# Patient Record
Sex: Male | Born: 1945 | Race: White | Hispanic: No | Marital: Married | Smoking: Former smoker
Health system: Southern US, Community
[De-identification: ages and names within clinical notes are randomized; demographics above are authoritative.]

## PROBLEM LIST (undated history)

## (undated) DIAGNOSIS — Z8719 Personal history of other diseases of the digestive system: Secondary | ICD-10-CM

## (undated) DIAGNOSIS — M1990 Unspecified osteoarthritis, unspecified site: Secondary | ICD-10-CM

## (undated) DIAGNOSIS — T7840XA Allergy, unspecified, initial encounter: Secondary | ICD-10-CM

## (undated) DIAGNOSIS — Z9189 Other specified personal risk factors, not elsewhere classified: Secondary | ICD-10-CM

## (undated) DIAGNOSIS — I10 Essential (primary) hypertension: Secondary | ICD-10-CM

## (undated) DIAGNOSIS — E119 Type 2 diabetes mellitus without complications: Secondary | ICD-10-CM

## (undated) DIAGNOSIS — Z9889 Other specified postprocedural states: Secondary | ICD-10-CM

## (undated) DIAGNOSIS — Z860100 Personal history of colon polyps, unspecified: Secondary | ICD-10-CM

## (undated) DIAGNOSIS — H269 Unspecified cataract: Secondary | ICD-10-CM

## (undated) DIAGNOSIS — N471 Phimosis: Secondary | ICD-10-CM

## (undated) DIAGNOSIS — K2270 Barrett's esophagus without dysplasia: Secondary | ICD-10-CM

## (undated) DIAGNOSIS — K219 Gastro-esophageal reflux disease without esophagitis: Secondary | ICD-10-CM

## (undated) DIAGNOSIS — Z973 Presence of spectacles and contact lenses: Secondary | ICD-10-CM

## (undated) DIAGNOSIS — K5730 Diverticulosis of large intestine without perforation or abscess without bleeding: Secondary | ICD-10-CM

## (undated) DIAGNOSIS — G43909 Migraine, unspecified, not intractable, without status migrainosus: Secondary | ICD-10-CM

## (undated) HISTORY — DX: Allergy, unspecified, initial encounter: T78.40XA

## (undated) HISTORY — PX: UPPER GASTROINTESTINAL ENDOSCOPY: SHX188

## (undated) HISTORY — DX: Unspecified cataract: H26.9

## (undated) HISTORY — PX: ROTATOR CUFF REPAIR: SHX139

## (undated) HISTORY — DX: Migraine, unspecified, not intractable, without status migrainosus: G43.909

## (undated) HISTORY — PX: POLYPECTOMY: SHX149

## (undated) HISTORY — DX: Gastro-esophageal reflux disease without esophagitis: K21.9

## (undated) HISTORY — DX: Essential (primary) hypertension: I10

## (undated) HISTORY — PX: COLONOSCOPY: SHX174

## (undated) HISTORY — PX: CHOLECYSTECTOMY: SHX55

---

## 1998-06-19 HISTORY — PX: BICEPS TENDON REPAIR: SHX566

## 1999-01-14 ENCOUNTER — Ambulatory Visit (HOSPITAL_COMMUNITY): Admission: RE | Admit: 1999-01-14 | Discharge: 1999-01-14 | Payer: Self-pay | Admitting: Family Medicine

## 1999-01-14 ENCOUNTER — Encounter: Payer: Self-pay | Admitting: Family Medicine

## 2000-05-02 ENCOUNTER — Encounter (INDEPENDENT_AMBULATORY_CARE_PROVIDER_SITE_OTHER): Payer: Self-pay | Admitting: Specialist

## 2000-05-02 ENCOUNTER — Other Ambulatory Visit: Admission: RE | Admit: 2000-05-02 | Discharge: 2000-05-02 | Payer: Self-pay | Admitting: Gastroenterology

## 2000-06-19 HISTORY — PX: KNEE ARTHROSCOPY: SUR90

## 2006-10-18 ENCOUNTER — Encounter: Admission: RE | Admit: 2006-10-18 | Discharge: 2006-10-18 | Payer: Self-pay | Admitting: Orthopedic Surgery

## 2007-05-13 ENCOUNTER — Ambulatory Visit: Payer: Self-pay | Admitting: Gastroenterology

## 2007-05-27 ENCOUNTER — Ambulatory Visit: Payer: Self-pay | Admitting: Gastroenterology

## 2007-05-27 ENCOUNTER — Encounter (INDEPENDENT_AMBULATORY_CARE_PROVIDER_SITE_OTHER): Payer: Self-pay | Admitting: *Deleted

## 2008-09-26 ENCOUNTER — Inpatient Hospital Stay (HOSPITAL_COMMUNITY): Admission: EM | Admit: 2008-09-26 | Discharge: 2008-10-02 | Payer: Self-pay | Admitting: Emergency Medicine

## 2008-09-26 ENCOUNTER — Encounter (INDEPENDENT_AMBULATORY_CARE_PROVIDER_SITE_OTHER): Payer: Self-pay | Admitting: *Deleted

## 2008-10-02 ENCOUNTER — Encounter (INDEPENDENT_AMBULATORY_CARE_PROVIDER_SITE_OTHER): Payer: Self-pay | Admitting: *Deleted

## 2008-10-05 ENCOUNTER — Encounter: Payer: Self-pay | Admitting: Gastroenterology

## 2008-11-02 ENCOUNTER — Encounter: Payer: Self-pay | Admitting: Gastroenterology

## 2008-12-09 DIAGNOSIS — R197 Diarrhea, unspecified: Secondary | ICD-10-CM | POA: Insufficient documentation

## 2008-12-09 DIAGNOSIS — K210 Gastro-esophageal reflux disease with esophagitis: Secondary | ICD-10-CM

## 2008-12-09 DIAGNOSIS — K869 Disease of pancreas, unspecified: Secondary | ICD-10-CM | POA: Insufficient documentation

## 2008-12-09 DIAGNOSIS — M109 Gout, unspecified: Secondary | ICD-10-CM

## 2008-12-09 DIAGNOSIS — N400 Enlarged prostate without lower urinary tract symptoms: Secondary | ICD-10-CM

## 2008-12-09 DIAGNOSIS — I10 Essential (primary) hypertension: Secondary | ICD-10-CM | POA: Insufficient documentation

## 2008-12-09 DIAGNOSIS — M1990 Unspecified osteoarthritis, unspecified site: Secondary | ICD-10-CM | POA: Insufficient documentation

## 2008-12-09 DIAGNOSIS — G7249 Other inflammatory and immune myopathies, not elsewhere classified: Secondary | ICD-10-CM | POA: Insufficient documentation

## 2008-12-09 DIAGNOSIS — N529 Male erectile dysfunction, unspecified: Secondary | ICD-10-CM

## 2008-12-09 DIAGNOSIS — E1165 Type 2 diabetes mellitus with hyperglycemia: Secondary | ICD-10-CM

## 2008-12-09 DIAGNOSIS — E7800 Pure hypercholesterolemia, unspecified: Secondary | ICD-10-CM | POA: Insufficient documentation

## 2008-12-09 DIAGNOSIS — K219 Gastro-esophageal reflux disease without esophagitis: Secondary | ICD-10-CM

## 2008-12-11 ENCOUNTER — Ambulatory Visit: Payer: Self-pay | Admitting: Gastroenterology

## 2008-12-11 DIAGNOSIS — K862 Cyst of pancreas: Secondary | ICD-10-CM | POA: Insufficient documentation

## 2008-12-11 DIAGNOSIS — K863 Pseudocyst of pancreas: Secondary | ICD-10-CM

## 2008-12-11 DIAGNOSIS — K859 Acute pancreatitis without necrosis or infection, unspecified: Secondary | ICD-10-CM | POA: Insufficient documentation

## 2008-12-17 ENCOUNTER — Encounter: Payer: Self-pay | Admitting: Gastroenterology

## 2008-12-18 ENCOUNTER — Encounter: Admission: RE | Admit: 2008-12-18 | Discharge: 2008-12-18 | Payer: Self-pay | Admitting: General Surgery

## 2009-01-01 ENCOUNTER — Inpatient Hospital Stay (HOSPITAL_COMMUNITY): Admission: AD | Admit: 2009-01-01 | Discharge: 2009-01-14 | Payer: Self-pay | Admitting: General Surgery

## 2009-01-01 ENCOUNTER — Encounter: Admission: RE | Admit: 2009-01-01 | Discharge: 2009-01-01 | Payer: Self-pay | Admitting: General Surgery

## 2009-01-07 ENCOUNTER — Encounter (INDEPENDENT_AMBULATORY_CARE_PROVIDER_SITE_OTHER): Payer: Self-pay | Admitting: General Surgery

## 2009-01-07 HISTORY — PX: OTHER SURGICAL HISTORY: SHX169

## 2009-01-26 ENCOUNTER — Encounter: Admission: RE | Admit: 2009-01-26 | Discharge: 2009-01-26 | Payer: Self-pay | Admitting: General Surgery

## 2009-01-28 ENCOUNTER — Encounter: Payer: Self-pay | Admitting: Gastroenterology

## 2009-03-02 ENCOUNTER — Encounter: Payer: Self-pay | Admitting: Gastroenterology

## 2010-03-30 IMAGING — CR DG CHEST 2V
2 series · 2 of 2 positions shown · non-contrast
Comparison: None

CLINICAL DATA: Cough, fever, abdominal pain

CHEST - 2 VIEW

[view not recorded (1 of 2)]
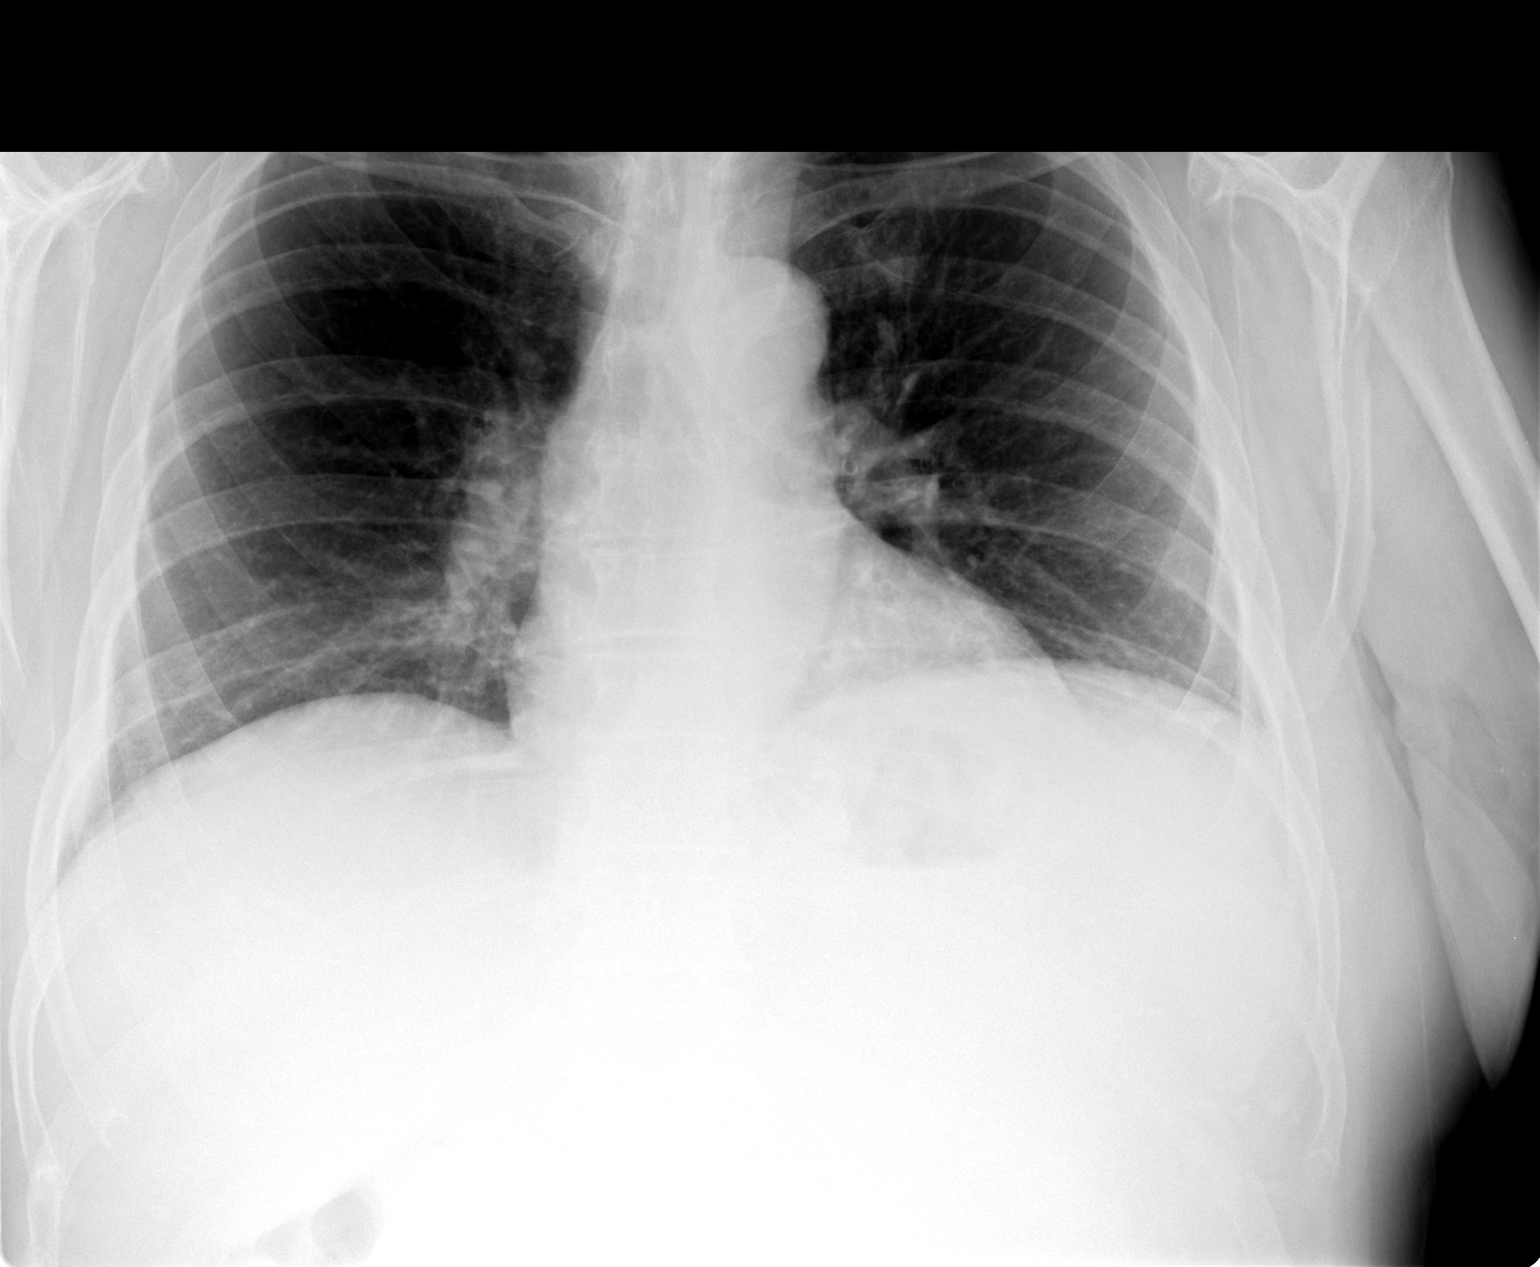

[view not recorded (2 of 2)]
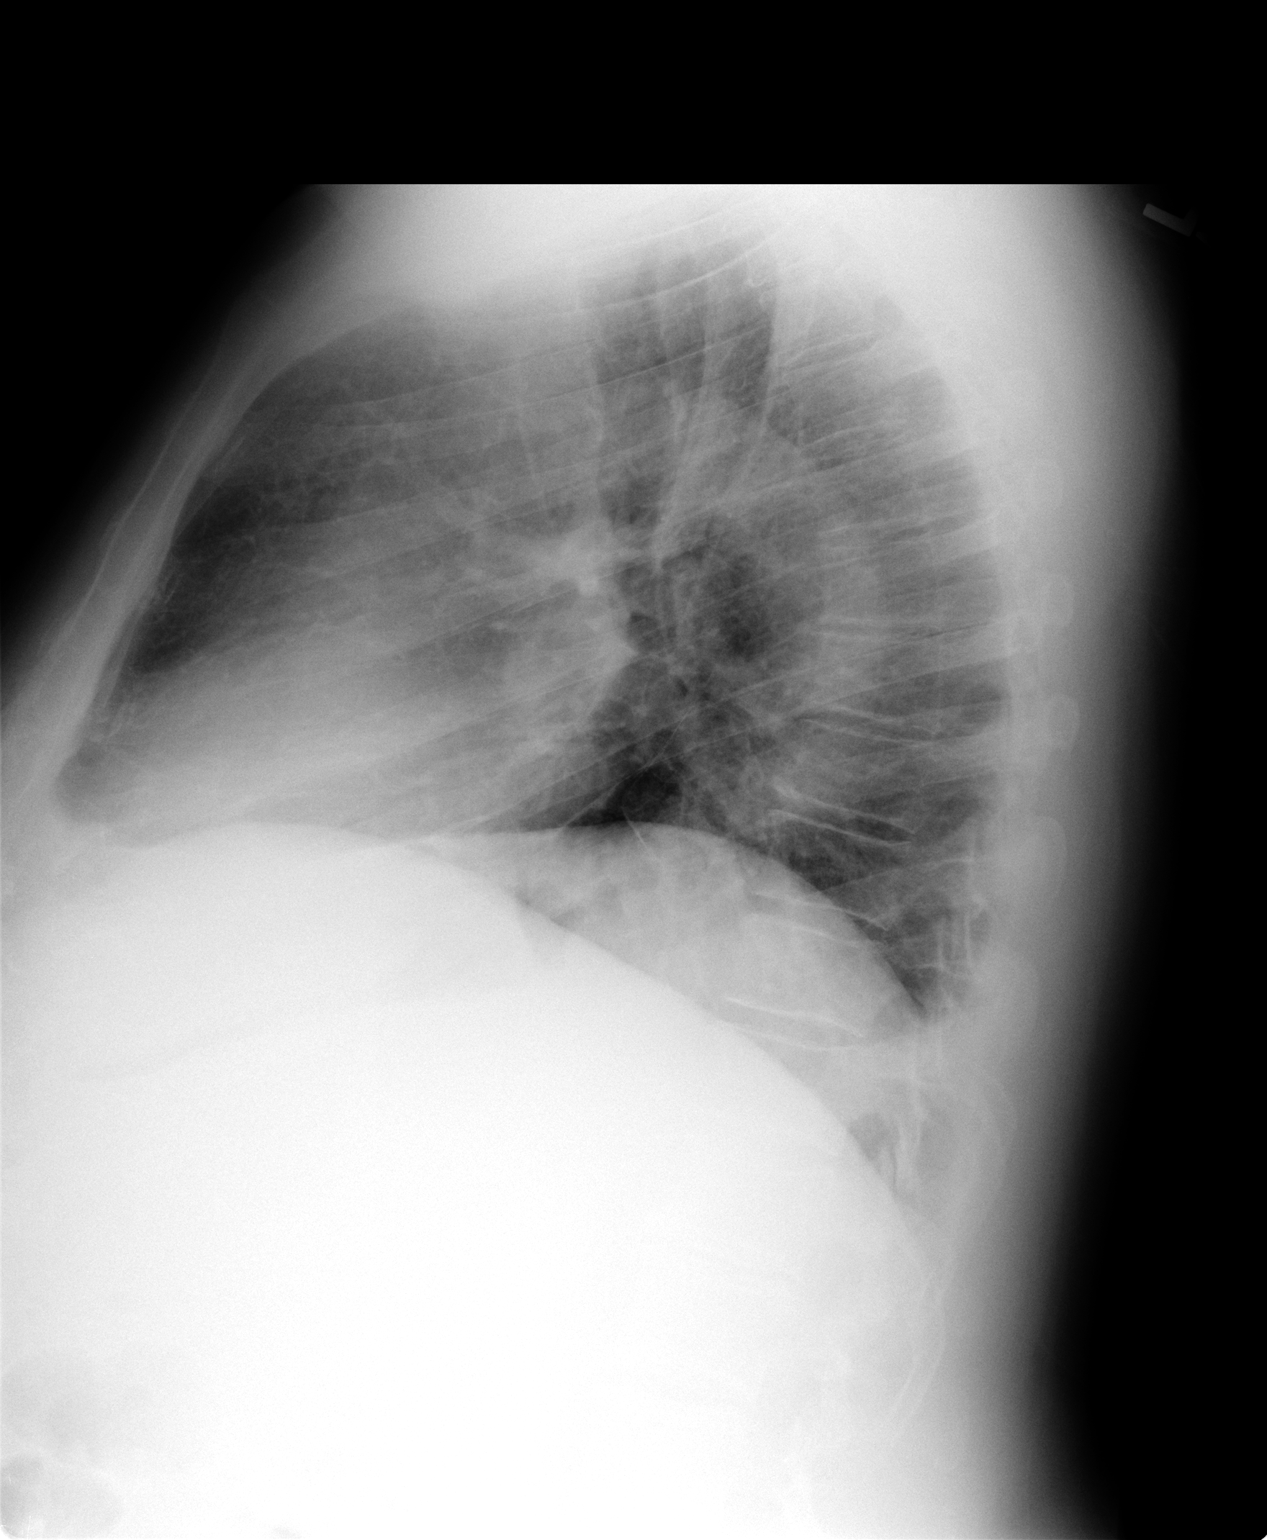

[2 of 2 positions shown; findings below may reference images not displayed]

FINDINGS: No active infiltrate or effusion is seen.  Mild
peribronchial thickening is noted.  The heart is within upper
limits of normal.  No acute bony abnormality is seen.
IMPRESSION: No active lung disease.  Mild peribronchial thickening.

## 2010-04-03 IMAGING — CR DG CHEST 1V PORT
1 series · 1 of 1 positions shown · non-contrast
Comparison: 01/01/2009

CLINICAL DATA: Right-sided PICC line placement

PORTABLE CHEST - 1 VIEW

[view not recorded]
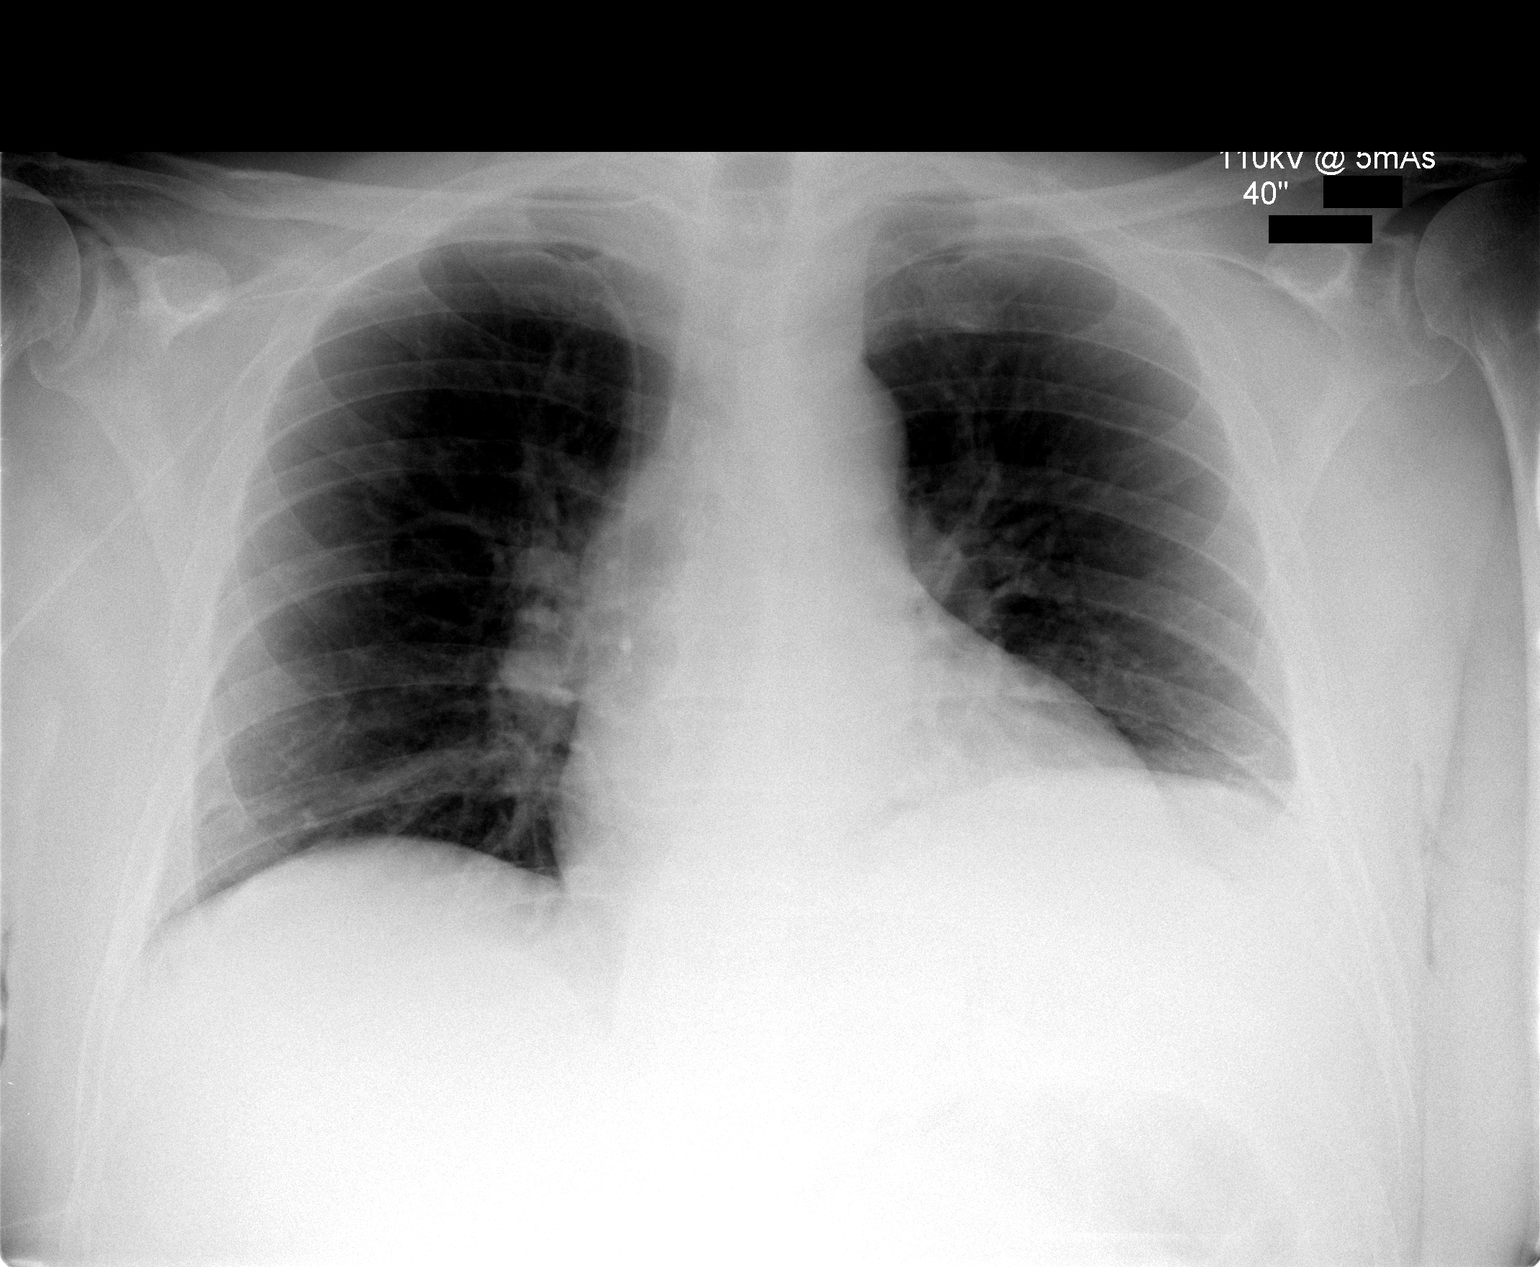

[1 of 1 positions shown; findings below may reference images not displayed]

FINDINGS: Right-sided PICC line noted with its tip at the
cavoatrial junction.  No pneumothorax.  Lungs are hypoaerated with
bibasilar atelectasis but clear.
IMPRESSION: Right-sided PICC line tip at cavoatrial junction.

## 2010-04-05 IMAGING — RF DG CHOLANGIOGRAM OPERATIVE
1 series · 4 of 4 positions shown · IV contrast (omnipaque)
Comparison: CT 01/01/2009

CLINICAL DATA: Pancreatic pseudocyst.

INTRAOPERATIVE CHOLANGIOGRAM
TECHNIQUE: Multiple fluoroscopic spot radiographs were obtained
during intraoperative cholangiogram and are submitted for
interpretation post-operatively.
Fluoroscopy Time: 23-second
Contrast: Omnipaque

[Series 1: run · 4 of 166 frames shown]
[frame 25/166]
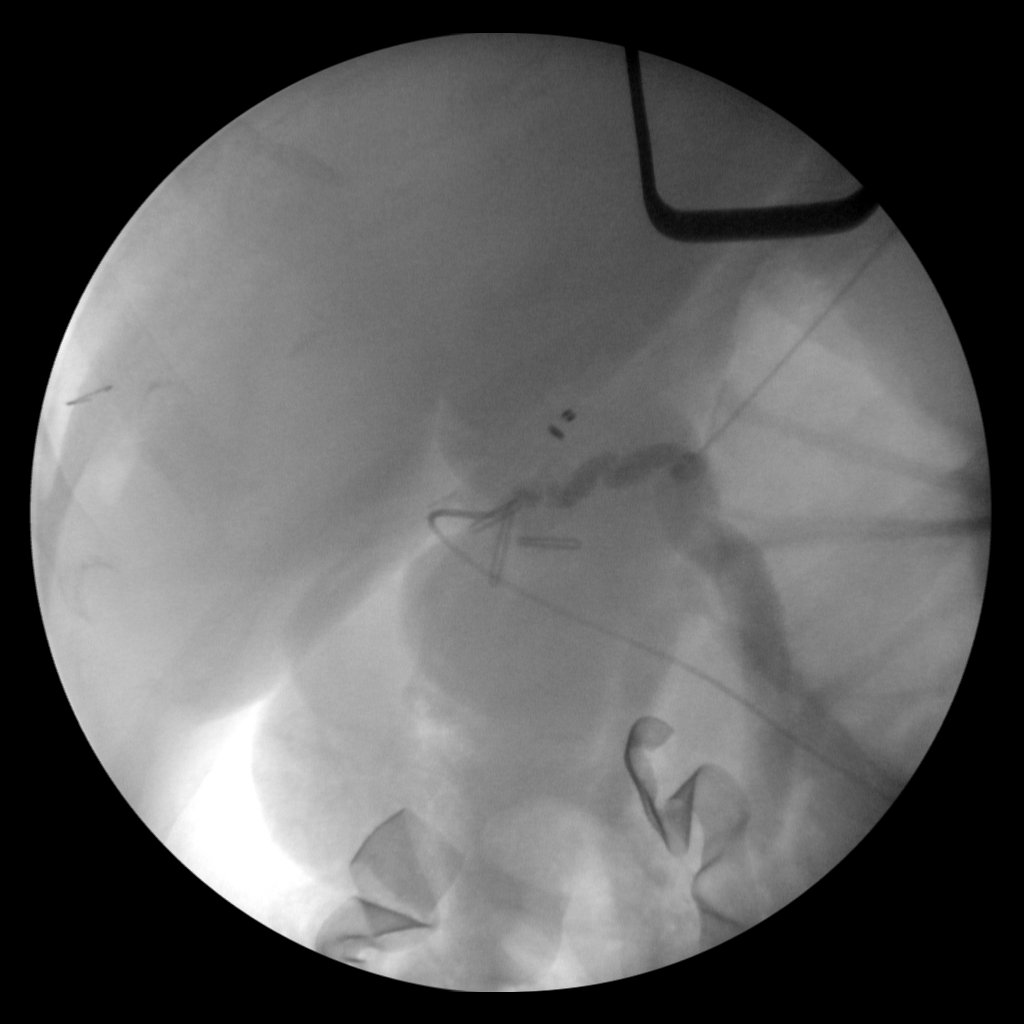
[frame 59/166]
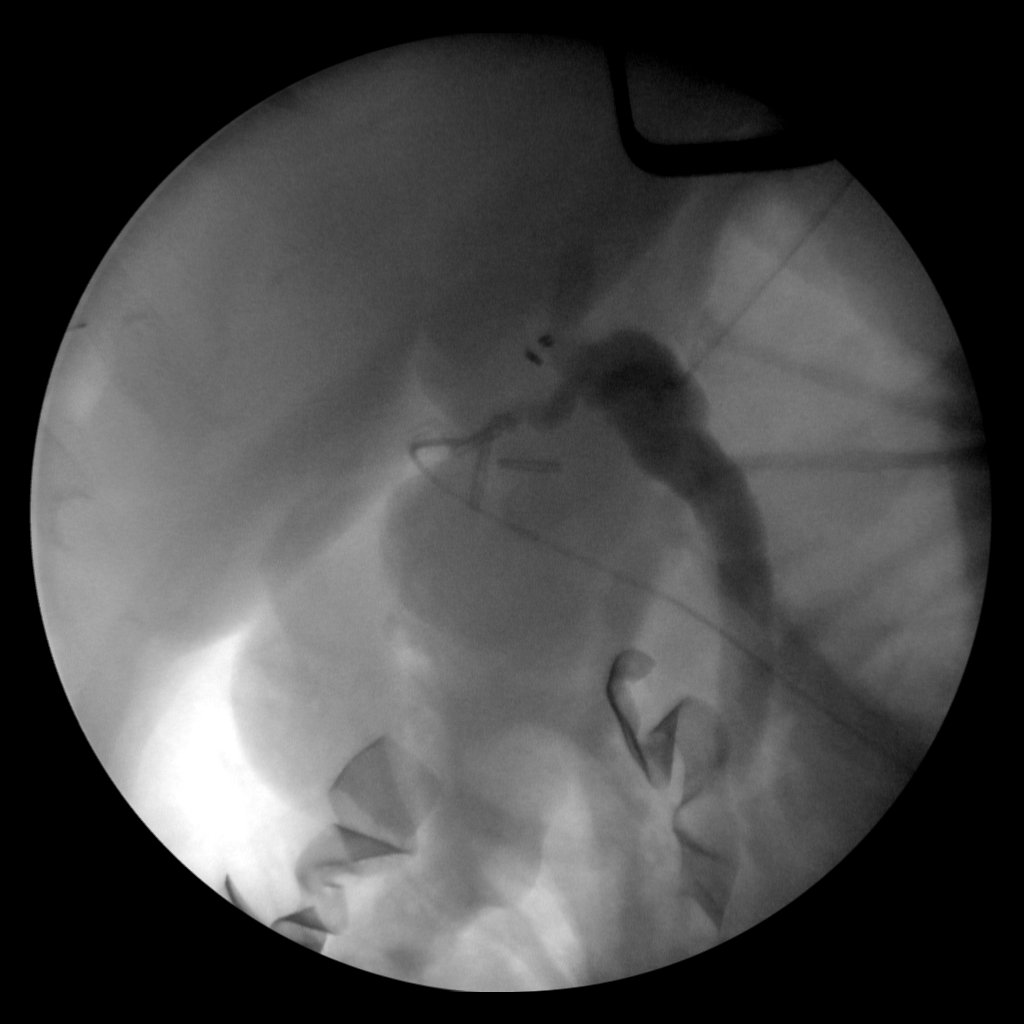
[frame 84/166]
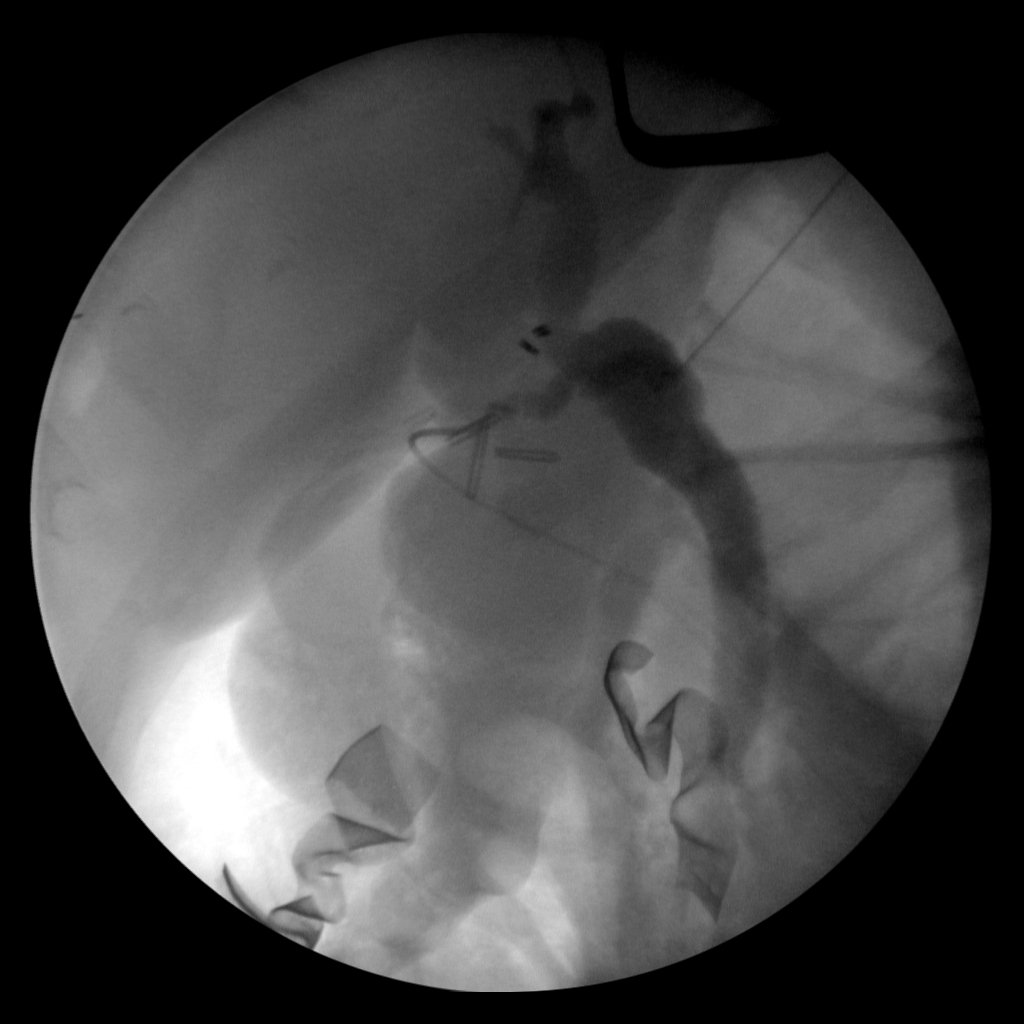
[frame 142/166]
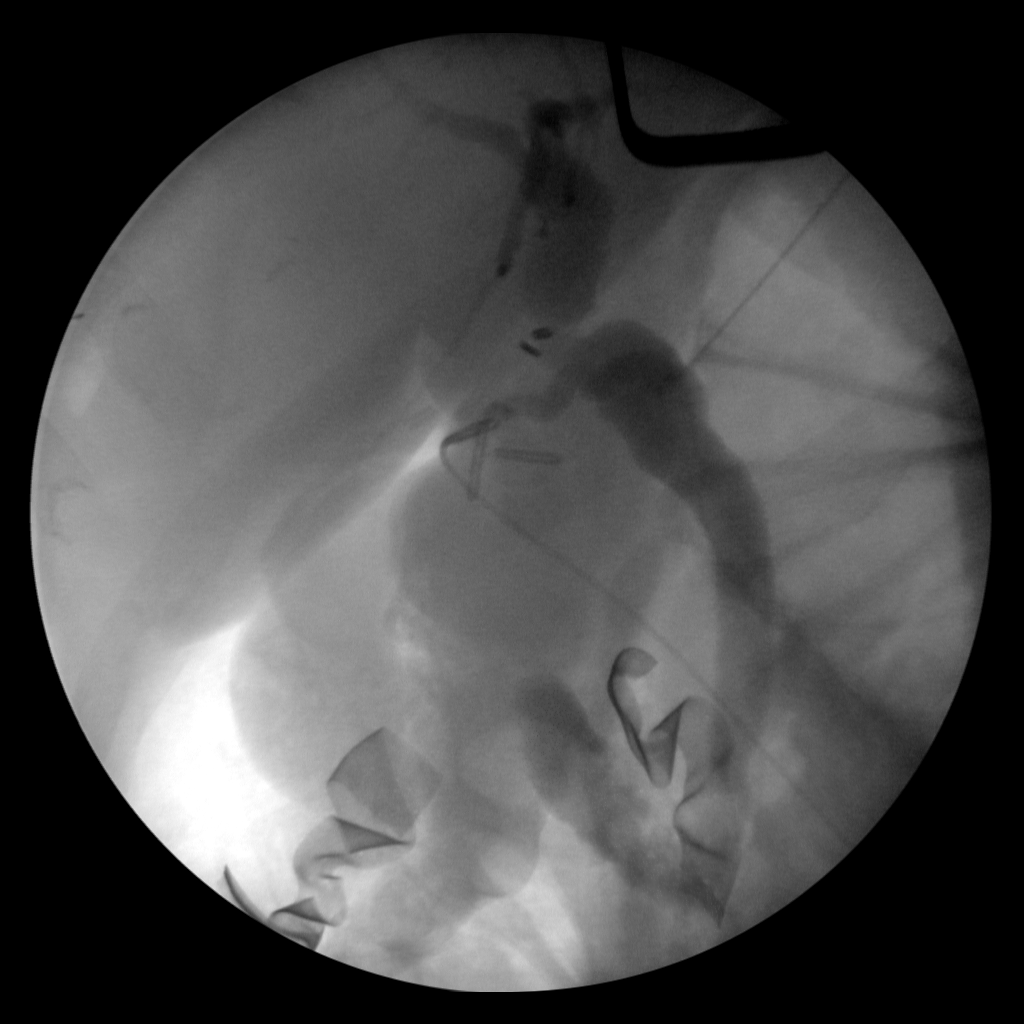

[4 of 4 positions shown; findings below may reference images not displayed]

FINDINGS: Multiple c-arm images were obtained.  Contrast injection
into  the cystic duct.  There is filling of the common bile duct
which is dilated.  There is partial filling of the intrahepatic
bile ducts.  Contrast does flow into the duodenum without
obstruction.  No filling defects in the bile ducts are noted.  No
evidence of biliary leak.
IMPRESSION: There is biliary dilatation without obstruction.  No common duct
stones are identified.  Ductal dilatation may be due to a large
pancreatic pseudocyst compressing the distal common bile duct.

## 2010-07-04 ENCOUNTER — Encounter: Payer: Self-pay | Admitting: Gastroenterology

## 2010-07-27 NOTE — Letter (Signed)
Summary: [facility]   Imported By: Rise Patience 07/18/2010 11:44:04  _____________________________________________________________________  External Attachment:    Type:   Image     Comment:   External Document

## 2010-09-25 LAB — GLUCOSE, CAPILLARY
Glucose-Capillary: 110 mg/dL — ABNORMAL HIGH (ref 70–99)
Glucose-Capillary: 111 mg/dL — ABNORMAL HIGH (ref 70–99)
Glucose-Capillary: 112 mg/dL — ABNORMAL HIGH (ref 70–99)
Glucose-Capillary: 115 mg/dL — ABNORMAL HIGH (ref 70–99)
Glucose-Capillary: 117 mg/dL — ABNORMAL HIGH (ref 70–99)
Glucose-Capillary: 122 mg/dL — ABNORMAL HIGH (ref 70–99)
Glucose-Capillary: 124 mg/dL — ABNORMAL HIGH (ref 70–99)
Glucose-Capillary: 126 mg/dL — ABNORMAL HIGH (ref 70–99)
Glucose-Capillary: 126 mg/dL — ABNORMAL HIGH (ref 70–99)
Glucose-Capillary: 133 mg/dL — ABNORMAL HIGH (ref 70–99)
Glucose-Capillary: 133 mg/dL — ABNORMAL HIGH (ref 70–99)
Glucose-Capillary: 133 mg/dL — ABNORMAL HIGH (ref 70–99)
Glucose-Capillary: 139 mg/dL — ABNORMAL HIGH (ref 70–99)
Glucose-Capillary: 139 mg/dL — ABNORMAL HIGH (ref 70–99)
Glucose-Capillary: 140 mg/dL — ABNORMAL HIGH (ref 70–99)
Glucose-Capillary: 141 mg/dL — ABNORMAL HIGH (ref 70–99)
Glucose-Capillary: 143 mg/dL — ABNORMAL HIGH (ref 70–99)
Glucose-Capillary: 144 mg/dL — ABNORMAL HIGH (ref 70–99)
Glucose-Capillary: 147 mg/dL — ABNORMAL HIGH (ref 70–99)
Glucose-Capillary: 149 mg/dL — ABNORMAL HIGH (ref 70–99)
Glucose-Capillary: 153 mg/dL — ABNORMAL HIGH (ref 70–99)
Glucose-Capillary: 154 mg/dL — ABNORMAL HIGH (ref 70–99)
Glucose-Capillary: 162 mg/dL — ABNORMAL HIGH (ref 70–99)
Glucose-Capillary: 162 mg/dL — ABNORMAL HIGH (ref 70–99)
Glucose-Capillary: 163 mg/dL — ABNORMAL HIGH (ref 70–99)
Glucose-Capillary: 164 mg/dL — ABNORMAL HIGH (ref 70–99)
Glucose-Capillary: 164 mg/dL — ABNORMAL HIGH (ref 70–99)
Glucose-Capillary: 173 mg/dL — ABNORMAL HIGH (ref 70–99)
Glucose-Capillary: 226 mg/dL — ABNORMAL HIGH (ref 70–99)
Glucose-Capillary: 84 mg/dL (ref 70–99)
Glucose-Capillary: 85 mg/dL (ref 70–99)
Glucose-Capillary: 87 mg/dL (ref 70–99)
Glucose-Capillary: 92 mg/dL (ref 70–99)

## 2010-09-25 LAB — CULTURE, ROUTINE-ABSCESS: Culture: NO GROWTH

## 2010-09-25 LAB — COMPREHENSIVE METABOLIC PANEL
ALT: 16 U/L (ref 0–53)
ALT: 23 U/L (ref 0–53)
ALT: 8 U/L (ref 0–53)
ALT: 8 U/L (ref 0–53)
ALT: <8 U/L (ref 0–53)
AST: 10 U/L (ref 0–37)
AST: 11 U/L (ref 0–37)
AST: 11 U/L (ref 0–37)
AST: 19 U/L (ref 0–37)
AST: 9 U/L (ref 0–37)
Albumin: 2.2 g/dL — ABNORMAL LOW (ref 3.5–5.2)
Albumin: 2.3 g/dL — ABNORMAL LOW (ref 3.5–5.2)
Albumin: 2.5 g/dL — ABNORMAL LOW (ref 3.5–5.2)
Albumin: 2.5 g/dL — ABNORMAL LOW (ref 3.5–5.2)
Albumin: 2.9 g/dL — ABNORMAL LOW (ref 3.5–5.2)
Alkaline Phosphatase: 40 U/L (ref 39–117)
Alkaline Phosphatase: 42 U/L (ref 39–117)
BUN: 11 mg/dL (ref 6–23)
BUN: 11 mg/dL (ref 6–23)
BUN: 20 mg/dL (ref 6–23)
BUN: 4 mg/dL — ABNORMAL LOW (ref 6–23)
CO2: 27 mEq/L (ref 19–32)
CO2: 28 mEq/L (ref 19–32)
CO2: 29 mEq/L (ref 19–32)
Calcium: 8.3 mg/dL — ABNORMAL LOW (ref 8.4–10.5)
Calcium: 8.4 mg/dL (ref 8.4–10.5)
Calcium: 8.5 mg/dL (ref 8.4–10.5)
Calcium: 8.7 mg/dL (ref 8.4–10.5)
Chloride: 100 mEq/L (ref 96–112)
Chloride: 102 mEq/L (ref 96–112)
Chloride: 102 mEq/L (ref 96–112)
Chloride: 105 mEq/L (ref 96–112)
Chloride: 105 mEq/L (ref 96–112)
Creatinine, Ser: 0.66 mg/dL (ref 0.4–1.5)
Creatinine, Ser: 0.69 mg/dL (ref 0.4–1.5)
Creatinine, Ser: 0.71 mg/dL (ref 0.4–1.5)
Creatinine, Ser: 0.76 mg/dL (ref 0.4–1.5)
Creatinine, Ser: 0.96 mg/dL (ref 0.4–1.5)
GFR calc Af Amer: >60 mL/min (ref >60)
GFR calc Af Amer: >60 mL/min (ref >60)
GFR calc Af Amer: >60 mL/min (ref >60)
GFR calc Af Amer: >60 mL/min (ref >60)
GFR calc Af Amer: >60 mL/min (ref >60)
GFR calc non Af Amer: >60 mL/min (ref >60)
GFR calc non Af Amer: >60 mL/min (ref >60)
GFR calc non Af Amer: >60 mL/min (ref >60)
Glucose, Bld: 146 mg/dL — ABNORMAL HIGH (ref 70–99)
Glucose, Bld: 183 mg/dL — ABNORMAL HIGH (ref 70–99)
Potassium: 3.9 mEq/L (ref 3.5–5.1)
Potassium: 5.3 mEq/L — ABNORMAL HIGH (ref 3.5–5.1)
Sodium: 132 mEq/L — ABNORMAL LOW (ref 135–145)
Sodium: 132 mEq/L — ABNORMAL LOW (ref 135–145)
Sodium: 134 mEq/L — ABNORMAL LOW (ref 135–145)
Sodium: 137 mEq/L (ref 135–145)
Total Bilirubin: 0.3 mg/dL (ref 0.3–1.2)
Total Bilirubin: 0.6 mg/dL (ref 0.3–1.2)
Total Bilirubin: 0.8 mg/dL (ref 0.3–1.2)
Total Bilirubin: 0.9 mg/dL (ref 0.3–1.2)
Total Protein: 5.4 g/dL — ABNORMAL LOW (ref 6.0–8.3)
Total Protein: 5.5 g/dL — ABNORMAL LOW (ref 6.0–8.3)
Total Protein: 5.6 g/dL — ABNORMAL LOW (ref 6.0–8.3)
Total Protein: 5.8 g/dL — ABNORMAL LOW (ref 6.0–8.3)
Total Protein: 5.8 g/dL — ABNORMAL LOW (ref 6.0–8.3)

## 2010-09-25 LAB — BASIC METABOLIC PANEL
BUN: 10 mg/dL (ref 6–23)
CO2: 30 mEq/L (ref 19–32)
CO2: 30 mEq/L (ref 19–32)
Calcium: 7.9 mg/dL — ABNORMAL LOW (ref 8.4–10.5)
Calcium: 8.2 mg/dL — ABNORMAL LOW (ref 8.4–10.5)
Chloride: 97 mEq/L (ref 96–112)
Chloride: 99 mEq/L (ref 96–112)
Creatinine, Ser: 0.56 mg/dL (ref 0.4–1.5)
GFR calc Af Amer: >60 mL/min (ref >60)
GFR calc Af Amer: >60 mL/min (ref >60)
GFR calc Af Amer: >60 mL/min (ref >60)
GFR calc non Af Amer: >60 mL/min (ref >60)
GFR calc non Af Amer: >60 mL/min (ref >60)
Glucose, Bld: 102 mg/dL — ABNORMAL HIGH (ref 70–99)
Glucose, Bld: 96 mg/dL (ref 70–99)
Potassium: 3.8 mEq/L (ref 3.5–5.1)
Potassium: 4.4 mEq/L (ref 3.5–5.1)
Sodium: 135 mEq/L (ref 135–145)
Sodium: 135 mEq/L (ref 135–145)

## 2010-09-25 LAB — PHOSPHORUS
Phosphorus: 3.6 mg/dL (ref 2.3–4.6)
Phosphorus: 3.6 mg/dL (ref 2.3–4.6)
Phosphorus: 5.4 mg/dL — ABNORMAL HIGH (ref 2.3–4.6)

## 2010-09-25 LAB — DIFFERENTIAL
Eosinophils Absolute: 0 10*3/uL (ref 0.0–0.7)
Eosinophils Absolute: 0.2 10*3/uL (ref 0.0–0.7)
Lymphocytes Relative: 15 % (ref 12–46)
Lymphs Abs: 0.7 10*3/uL (ref 0.7–4.0)
Lymphs Abs: 1.1 10*3/uL (ref 0.7–4.0)
Monocytes Absolute: 0.5 10*3/uL (ref 0.1–1.0)
Monocytes Relative: 19 % — ABNORMAL HIGH (ref 3–12)
Monocytes Relative: 7 % (ref 3–12)
Neutrophils Relative %: 66 % (ref 43–77)
Neutrophils Relative %: 73 % (ref 43–77)

## 2010-09-25 LAB — AMYLASE: Amylase: 18 U/L — ABNORMAL LOW (ref 27–131)

## 2010-09-25 LAB — CBC
HCT: 30 % — ABNORMAL LOW (ref 39.0–52.0)
Hemoglobin: 12.4 g/dL — ABNORMAL LOW (ref 13.0–17.0)
Hemoglobin: 9.9 g/dL — ABNORMAL LOW (ref 13.0–17.0)
MCHC: 33.3 g/dL (ref 30.0–36.0)
MCHC: 33.6 g/dL (ref 30.0–36.0)
MCHC: 34 g/dL (ref 30.0–36.0)
MCV: 86.2 fL (ref 78.0–100.0)
MCV: 86.5 fL (ref 78.0–100.0)
MCV: 86.6 fL (ref 78.0–100.0)
MCV: 86.7 fL (ref 78.0–100.0)
MCV: 86.8 fL (ref 78.0–100.0)
Platelets: 109 10*3/uL — ABNORMAL LOW (ref 150–400)
Platelets: 141 10*3/uL — ABNORMAL LOW (ref 150–400)
Platelets: 245 10*3/uL (ref 150–400)
RBC: 3.48 MIL/uL — ABNORMAL LOW (ref 4.22–5.81)
RBC: 4.21 MIL/uL — ABNORMAL LOW (ref 4.22–5.81)
RBC: 4.29 MIL/uL (ref 4.22–5.81)
RDW: 13.7 % (ref 11.5–15.5)
RDW: 13.8 % (ref 11.5–15.5)
RDW: 14.1 % (ref 11.5–15.5)
WBC: 10.6 10*3/uL — ABNORMAL HIGH (ref 4.0–10.5)
WBC: 4.5 10*3/uL (ref 4.0–10.5)
WBC: 7.3 10*3/uL (ref 4.0–10.5)

## 2010-09-25 LAB — ANAEROBIC CULTURE: Special Requests: 2

## 2010-09-25 LAB — PREALBUMIN
Prealbumin: 5.8 mg/dL — ABNORMAL LOW (ref 18.0–45.0)
Prealbumin: 7.5 mg/dL — ABNORMAL LOW (ref 18.0–45.0)

## 2010-09-25 LAB — TRIGLYCERIDES: Triglycerides: 99 mg/dL (ref <150)

## 2010-09-25 LAB — GRAM STAIN

## 2010-09-25 LAB — LIPASE, BLOOD: Lipase: 12 U/L (ref 11–59)

## 2010-09-25 LAB — MAGNESIUM
Magnesium: 1.9 mg/dL (ref 1.5–2.5)
Magnesium: 2.1 mg/dL (ref 1.5–2.5)

## 2010-09-25 LAB — CHOLESTEROL, TOTAL: Cholesterol: 116 mg/dL (ref 0–200)

## 2010-09-28 LAB — CBC
HCT: 33.5 % — ABNORMAL LOW (ref 39.0–52.0)
HCT: 36.2 % — ABNORMAL LOW (ref 39.0–52.0)
HCT: 41.9 % (ref 39.0–52.0)
Hemoglobin: 11.4 g/dL — ABNORMAL LOW (ref 13.0–17.0)
Hemoglobin: 11.5 g/dL — ABNORMAL LOW (ref 13.0–17.0)
Hemoglobin: 12.3 g/dL — ABNORMAL LOW (ref 13.0–17.0)
MCHC: 33.6 g/dL (ref 30.0–36.0)
MCHC: 33.8 g/dL (ref 30.0–36.0)
MCV: 90.4 fL (ref 78.0–100.0)
MCV: 90.4 fL (ref 78.0–100.0)
MCV: 91.2 fL (ref 78.0–100.0)
MCV: 91.3 fL (ref 78.0–100.0)
Platelets: 122 K/uL — ABNORMAL LOW (ref 150–400)
Platelets: 153 10*3/uL (ref 150–400)
Platelets: 211 10*3/uL (ref 150–400)
Platelets: 262 10*3/uL (ref 150–400)
RBC: 3.97 MIL/uL — ABNORMAL LOW (ref 4.22–5.81)
RBC: 4.59 MIL/uL (ref 4.22–5.81)
RDW: 13.4 % (ref 11.5–15.5)
RDW: 13.4 % (ref 11.5–15.5)
RDW: 14.2 % (ref 11.5–15.5)
WBC: 11 K/uL — ABNORMAL HIGH (ref 4.0–10.5)
WBC: 12.1 10*3/uL — ABNORMAL HIGH (ref 4.0–10.5)
WBC: 20.1 10*3/uL — ABNORMAL HIGH (ref 4.0–10.5)
WBC: 9 10*3/uL (ref 4.0–10.5)

## 2010-09-28 LAB — GLUCOSE, CAPILLARY
Glucose-Capillary: 137 mg/dL — ABNORMAL HIGH (ref 70–99)
Glucose-Capillary: 149 mg/dL — ABNORMAL HIGH (ref 70–99)
Glucose-Capillary: 150 mg/dL — ABNORMAL HIGH (ref 70–99)
Glucose-Capillary: 151 mg/dL — ABNORMAL HIGH (ref 70–99)
Glucose-Capillary: 153 mg/dL — ABNORMAL HIGH (ref 70–99)
Glucose-Capillary: 155 mg/dL — ABNORMAL HIGH (ref 70–99)
Glucose-Capillary: 163 mg/dL — ABNORMAL HIGH (ref 70–99)
Glucose-Capillary: 163 mg/dL — ABNORMAL HIGH (ref 70–99)
Glucose-Capillary: 164 mg/dL — ABNORMAL HIGH (ref 70–99)
Glucose-Capillary: 168 mg/dL — ABNORMAL HIGH (ref 70–99)
Glucose-Capillary: 172 mg/dL — ABNORMAL HIGH (ref 70–99)
Glucose-Capillary: 182 mg/dL — ABNORMAL HIGH (ref 70–99)
Glucose-Capillary: 196 mg/dL — ABNORMAL HIGH (ref 70–99)
Glucose-Capillary: 200 mg/dL — ABNORMAL HIGH (ref 70–99)
Glucose-Capillary: 200 mg/dL — ABNORMAL HIGH (ref 70–99)
Glucose-Capillary: 204 mg/dL — ABNORMAL HIGH (ref 70–99)

## 2010-09-28 LAB — COMPREHENSIVE METABOLIC PANEL WITH GFR
ALT: 35 U/L (ref 0–53)
AST: 35 U/L (ref 0–37)
Albumin: 2.7 g/dL — ABNORMAL LOW (ref 3.5–5.2)
Alkaline Phosphatase: 45 U/L (ref 39–117)
BUN: 20 mg/dL (ref 6–23)
CO2: 25 meq/L (ref 19–32)
Calcium: 7.1 mg/dL — ABNORMAL LOW (ref 8.4–10.5)
Chloride: 114 meq/L — ABNORMAL HIGH (ref 96–112)
Creatinine, Ser: 0.99 mg/dL (ref 0.4–1.5)
GFR calc non Af Amer: >60 mL/min
Glucose, Bld: 158 mg/dL — ABNORMAL HIGH (ref 70–99)
Potassium: 4.5 meq/L (ref 3.5–5.1)
Sodium: 143 meq/L (ref 135–145)
Total Bilirubin: 1.8 mg/dL — ABNORMAL HIGH (ref 0.3–1.2)
Total Protein: 5.5 g/dL — ABNORMAL LOW (ref 6.0–8.3)

## 2010-09-28 LAB — COMPREHENSIVE METABOLIC PANEL
AST: 102 U/L — ABNORMAL HIGH (ref 0–37)
AST: 40 U/L — ABNORMAL HIGH (ref 0–37)
AST: 41 U/L — ABNORMAL HIGH (ref 0–37)
Albumin: 3.6 g/dL (ref 3.5–5.2)
Albumin: 4.3 g/dL (ref 3.5–5.2)
BUN: 28 mg/dL — ABNORMAL HIGH (ref 6–23)
CO2: 24 mEq/L (ref 19–32)
Calcium: 7.3 mg/dL — ABNORMAL LOW (ref 8.4–10.5)
Calcium: 9.1 mg/dL (ref 8.4–10.5)
Chloride: 103 mEq/L (ref 96–112)
Chloride: 110 mEq/L (ref 96–112)
Chloride: 113 mEq/L — ABNORMAL HIGH (ref 96–112)
Creatinine, Ser: 0.94 mg/dL (ref 0.4–1.5)
Creatinine, Ser: 1.26 mg/dL (ref 0.4–1.5)
Creatinine, Ser: 1.39 mg/dL (ref 0.4–1.5)
GFR calc Af Amer: >60 mL/min (ref >60)
GFR calc Af Amer: >60 mL/min (ref >60)
GFR calc Af Amer: >60 mL/min (ref >60)
GFR calc non Af Amer: 52 mL/min — ABNORMAL LOW (ref >60)
Glucose, Bld: 182 mg/dL — ABNORMAL HIGH (ref 70–99)
Potassium: 5 mEq/L (ref 3.5–5.1)
Total Bilirubin: 0.6 mg/dL (ref 0.3–1.2)
Total Bilirubin: 1.1 mg/dL (ref 0.3–1.2)
Total Bilirubin: 1.5 mg/dL — ABNORMAL HIGH (ref 0.3–1.2)
Total Protein: 6.4 g/dL (ref 6.0–8.3)
Total Protein: 7.3 g/dL (ref 6.0–8.3)

## 2010-09-28 LAB — BASIC METABOLIC PANEL WITH GFR
BUN: 11 mg/dL (ref 6–23)
CO2: 31 meq/L (ref 19–32)
Calcium: 7.6 mg/dL — ABNORMAL LOW (ref 8.4–10.5)
Chloride: 101 meq/L (ref 96–112)
Creatinine, Ser: 0.83 mg/dL (ref 0.4–1.5)
GFR calc non Af Amer: >60 mL/min
Glucose, Bld: 160 mg/dL — ABNORMAL HIGH (ref 70–99)
Potassium: 4.1 meq/L (ref 3.5–5.1)
Sodium: 139 meq/L (ref 135–145)

## 2010-09-28 LAB — LIPID PANEL
HDL: 40 mg/dL (ref >39)
Total CHOL/HDL Ratio: 4.2 RATIO
Triglycerides: 110 mg/dL (ref <150)

## 2010-09-28 LAB — DIFFERENTIAL
Eosinophils Relative: 1 % (ref 0–5)
Lymphocytes Relative: 15 % (ref 12–46)
Lymphs Abs: 3 10*3/uL (ref 0.7–4.0)
Monocytes Absolute: 0.9 10*3/uL (ref 0.1–1.0)
Monocytes Relative: 5 % (ref 3–12)

## 2010-09-28 LAB — HEPATIC FUNCTION PANEL
ALT: 26 U/L (ref 0–53)
AST: 20 U/L (ref 0–37)
Albumin: 2.3 g/dL — ABNORMAL LOW (ref 3.5–5.2)
Alkaline Phosphatase: 52 U/L (ref 39–117)
Bilirubin, Direct: 0.5 mg/dL — ABNORMAL HIGH (ref 0.0–0.3)
Indirect Bilirubin: 0.6 mg/dL (ref 0.3–0.9)
Total Bilirubin: 1.1 mg/dL (ref 0.3–1.2)
Total Protein: 4.8 g/dL — ABNORMAL LOW (ref 6.0–8.3)

## 2010-09-28 LAB — LIPASE, BLOOD
Lipase: 23 U/L (ref 11–59)
Lipase: 362 U/L — ABNORMAL HIGH (ref 11–59)
Lipase: 76 U/L — ABNORMAL HIGH (ref 11–59)

## 2010-09-28 LAB — BASIC METABOLIC PANEL
GFR calc non Af Amer: >60 mL/min (ref >60)
Potassium: 4.4 mEq/L (ref 3.5–5.1)
Sodium: 142 mEq/L (ref 135–145)

## 2010-09-28 LAB — APTT: aPTT: 25 seconds (ref 24–37)

## 2010-09-28 LAB — BRAIN NATRIURETIC PEPTIDE
Pro B Natriuretic peptide (BNP): <30 pg/mL (ref 0.0–100.0)
Pro B Natriuretic peptide (BNP): <30 pg/mL (ref 0.0–100.0)

## 2010-09-28 LAB — AMYLASE: Amylase: 691 U/L — ABNORMAL HIGH (ref 27–131)

## 2010-09-28 LAB — MAGNESIUM: Magnesium: 2.2 mg/dL (ref 1.5–2.5)

## 2010-09-28 LAB — PHOSPHORUS: Phosphorus: 4.8 mg/dL — ABNORMAL HIGH (ref 2.3–4.6)

## 2010-09-28 LAB — POCT CARDIAC MARKERS

## 2010-11-01 NOTE — H&P (Signed)
NAME:  Benjamin Clayton, Benjamin Clayton NO.:  [id number]   MEDICAL RECORD NO.:  [id number]          PATIENT TYPE:  EMS   LOCATION:  ED                           FACILITY:  [facility]   PHYSICIAN:  Dena Billet, MD     DATE OF BIRTH:  [birth year]   DATE OF ADMISSION:  09/26/2008  DATE OF DISCHARGE:                              HISTORY & PHYSICAL   PRIMARY CARE PHYSICIAN:  Gilford Rile, M.D., fax [phone].   ATTENDING PHYSICIAN:  [facility]   CHIEF COMPLAINT:  Abdominal pain and vomiting.   HISTORY OF PRESENT ILLNESS:  This is a 65 year old white male patient  with a past medical history significant for hypertension, hyperlipidemia  and GERD, who was apparently asymptomatic approximately a month ago when  the patient started having episodic abdominal pain.  The patient  describes that the pain would occur after he had eaten pasta or a pizza  and would be midepigastric in nature, would be constant, and will stay  for a couple hours, and then goes away after the patient takes some  Prilosec.  The patient's pain came 3 times in the past and this is the  4th episode.  Approximately 12 noon this pain started in his mid  epigastric region, which was constant and severe pain, 10/10 on a scale  of 1-10, when 10 is the worst pain, was non radiating and non colicky.  The patient also vomited 4 times this time before he came to the  emergency room.   REVIEW OF SYSTEMS:  As above.  Rest of the review of systems was  negative.   PAST MEDICAL HISTORY:  1. Hypotension.  2. Hypercholesteremia.  3. GERD.  4. Obesity.   PAST SURGICAL HISTORY:  1. bilateral rotator cuff repair.  2. Biceps tendon repair.  3. Right knee arthroscopy.   FAMILY HISTORY:  No history of coronary artery disease, hypertension,  diabetes or stroke in family.   MEDICATIONS:  1. Lisinopril 20 daily.  2. Simvastatin 20 mg daily.  3. Prilosec 20 mg daily.   ALLERGIES:  NKDA.   SOCIAL HISTORY:  Nonsmoker,  non IV drug abuser.  Occasional alcohol  intake.   PHYSICAL EXAMINATION:  VITALS:  Temperature is 99.4, respirations 24,  pulse 76, blood pressure 97/49, pulse ox 99% on room air.  Patient  awake, alert, oriented x3.  Does appear to be in some distress secondary  to the abdominal pain.  HEENT:  Pupils equal, round, react to light, no icterus.  Mild pallor.  Extraocular movements intact.  Oral mucosa is dry.  NECK:  Supple.  No JVD.  No lymphadenopathy, no thyromegaly.  CVS:  S1-S2 regular.  No murmurs, heaves or gallops.  CHEST:  Clear.  ABDOMEN:  Soft.  The patient has a tenderness on superficial palpation  of the mid epigastric area.  There is no rebound.  Bowel sounds are  hypoactive.  No hepatosplenomegaly.  Abdomen is obese.  EXTREMITIES:  Peripheral pulses are present, no clubbing, cyanosis or  edema.  CNS:  Sensory and motor grossly intact.  Cranial nerves II-XII  are  intact.  SKIN:  No rashes.  MUSCULOSKELETAL:  Unremarkable.   LABORATORIES:  The patient's glucose is 177, AST is 102, ALT is 95.  CT  scan showed acute pancreatitis. Amylase/lipase is still pending.  White  count is 20.1, hemoglobin/hematocrit are 16.1 and 47.6.   IMPRESSION:  1. Acute pancreatitis.  2. Dehydration.  3. Abdominal pain.  4. Vomiting.  5. Leukocytosis.  6. Abnormal liver function tests.   PLAN:  Admit to med-surg, n.p.o., IV Dilaudid, IV fluids.  Check the  right upper quadrant ultrasound as well.  The patient's Zocor was  started in December 2009, that could have something to do with the  patient's pancreatitis, possibility.  Although the patient is obese, has  hypertriglyceridemia as well, which may have contributed to gallstone  which he might have passed at this moment.  We will check the right  upper quadrant ultrasound to confirm it.      Dena Billet, MD  Electronically Signed     NS/MEDQ  D:  09/26/2008  T:  09/26/2008  Job:  [id number]   cc:   Gilford Rile, MD  Fax:  [phone]

## 2010-11-01 NOTE — H&P (Signed)
NAME:  Benjamin Clayton, Benjamin Clayton NO.:  [id number]   MEDICAL RECORD NO.:  [id number]          PATIENT TYPE:  INP   LOCATION:  [id number]                         FACILITY:  [facility]   PHYSICIAN:  Odis Hollingshead, M.D.DATE OF BIRTH:  [birth year]   DATE OF ADMISSION:  01/01/2009  DATE OF DISCHARGE:                              HISTORY & PHYSICAL   REASON:  Epigastric pain, pancreatic pseudocyst.   HISTORY:  This 65 year old male had acute pancreatitis which has been  complicated by the pancreatic pseudocyst.  It has been arranged that he  is going to have operative drainage of the pseudocyst by Dr. Dalbert Batman  later this month.  Over the past 24 to 36 hours, he has developed  abdominal pain and high fevers up to 102.6.  Not much of an appetite.  He had a chest x-ray demonstrating no pneumonia.  A CBC demonstrated a  normal white blood cell count with slightly low platelet count at  132,000.  Liver function tests were normal but glucose was slightly  elevated at 162.  He was subsequently told to come to the urgent office.   PAST MEDICAL HISTORY:  1. Pancreatitis with pseudocyst.  2. Transient type 2 diabetes.  3. Morbid obesity.  4. Osteoarthritis.  5. Gastroesophageal disease.   PREVIOUS OPERATIONS:  1. Knee arthroscopy.  2. Bilateral rotator cuff repair.  3. Biceps tendon repair.   CURRENT MEDICATIONS:  Prilosec OTC.   ALLERGIES:  NONE.   SOCIAL HISTORY:  He is a former smoker.  Rare to occasional alcohol use.  Married.   REVIEW OF SYSTEMS:  No cardiac or chronic pulmonary disease.   PHYSICAL EXAM:  GENERAL:  He is a slightly ill-appearing male.  His  temperature is 98.9.  Blood pressure is 119/77.  The pulse is 100.  EYES:  Extraocular motions intact.  No icterus.  RESPIRATORY:  Breath sounds equal and clear.  Respirations unlabored.  CARDIAC:  Increased rate.  Regular rhythm.  I hear no murmurs.  ABDOMEN:  Soft with mild to moderate epigastric tenderness.  Hypoactive  bowel sounds.  No palpable masses.  No organomegaly.  MUSCULOSKELETAL:  No edema.  Good range of motion.  SKIN:  No jaundice.   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  Bowel rest.  IV fluid hydration.  Empiric IV antibiotics.  We will check CT scan as well.      Odis Hollingshead, M.D.  Electronically Signed     TJR/MEDQ  D:  01/01/2009  T:  01/01/2009  Job:  [id number]

## 2010-11-01 NOTE — Group Therapy Note (Signed)
NAME:  Benjamin Clayton, Benjamin Clayton NO.:  [id number]   MEDICAL RECORD NO.:  [id number]          PATIENT TYPE:  INP   LOCATION:  [id number]                         FACILITY:  [facility]   PHYSICIAN:  Sherryl Manges, M.D.  DATE OF BIRTH:  1945/07/01                                 PROGRESS NOTE   DATE OF DISCHARGE:  To be determined.   PRIMARY MD:  Gilford Rile, M.D.  Patient is unassigned to [place].   DISCHARGE DIAGNOSES:  1. Acute pancreatitis, query etiology.  2. Possible passed transiently obstructive gallstone, causing #1      above.  3. Hepatic steatosis.  4. Dehydration  5. Hypertension.  6. Obesity.  7. Dyslipidemia.  8. Gastroesophageal reflux disease.   DISCHARGE MEDICATIONS:  These will be listed in addendum at the appropriate time, by discharging  MD.   PROCEDURES:  1. Abdominal CT scan dated September 26, 2008.  This showed swollen      pancreas with abnormal edema within and surrounding the pancreas      involving the anterior perirenal spaces.  Findings are compatible      with acute pancreatitis.  There was no evidence of pancreatic      hemorrhage, abscess or pseudocyst.  Liver, spleen, kidneys and      adrenal glands were normal with the exception of a 12-mm cyst in      the left hepatic lobe and a 7-mm cyst in the right hepatic lobe.      Gallbladder was unremarkable, no biliary ductal dilatation.      Abdominal aorta was normal in caliber.  2. Pelvic CT scan dated September 26, 2008.  This was a negative CT of the      pelvis.  3. Abdominal ultrasound scan dated September 26, 2008.  This showed liver      slightly increased in echogenicity.  There was a small 1-cm cyst      adjacent to the gallbladder fossa and no evidence of ductal      dilatation.  Gallbladder wall was mildly thickened at 3 mm.  No      pericholecystic fluid.  No evidence of echogenic gallstones.  The      common bile duct was mildly dilated at 7 mm.  Pancreas was obscured      by overlying bowel/gas.   Spleen was normal.  Echogenic liver      commonly represents hepatic steatosis.  There was a simple cyst in      the right kidney.   CONSULTATIONS:  None.   ADMISSION HISTORY:  The H and P notes of September 26, 2008, dictated by Dr. Dena Billet.  However, in brief, this is a 65 year old male, with known history of  hypertension, dyslipidemia, GERD, obesity, status post bilateral rotator  cuff repair, status post biceps tendon repair, status post right knee  arthroscopy, presenting with severe abdominal pain and vomiting, which  commenced approximately 12 noon on September 26, 2008.  This was constant.  The patient vomited 4 times before he came to emergency department.  On  detailed questioning, it appears that  the patient has had similar pain  approximately 3 times in the past 1 month, usually following a meal of  pizza or pasta.  These episodes last a couple of hours and tended to be  relieved by proton pump inhibitor taken on a p.r.n. basis.  The  patient's initial laboratory studies demonstrated an AST of 102, ALT 95.  Amylase was over 2000.  Abnormal CT scan demonstrated acute  pancreatitis.  The patient was admitted for further evaluation,  investigation and management.   CLINICAL COURSE:  1. Acute pancreatitis.  For details of presentation, refer to      admission history above.  As described above, the patient's      abdominal CT scan confirmed acute pancreatitis without pancreatic      necrosis, bleeding or abscess.  Gallbladder was unremarkable.      There was no biliary ductal dilatation.  Liver was somewhat      echogenic in character and there were simple cysts in the liver.      Serum lipase was over 2000.  The patient was managed with bowel      rest, intravenous fluid hydration, proton pump inhibitor treatment      and p.r.n. opioid analgesics, with satisfactory clinical response.      By September 27, 2008, lipase level had dropped down to 369 and by      September 29, 2008,  was almost normal at 78.  The patient's symptoms      ameliorated in parallel with diminution of serum lipase levels, and      by September 29, 2008, we were able to commence him on clear fluids,      which he has tolerated quite well.  Abdominal pain had by then,      considerably ameliorated.  A careful search for etiology of      patient's acute pancreatitis was quite unrevealing.  The patient      drinks alcohol only very occasionally.  No gallstones were      demonstrated on imaging studies; although, common bile duct  was      felt to be at the upper limit of normal or mildly dilated at 7 mm.      It is postulated that the patient may have had a migrating      gallstone precipitating episodes of what may have been biliary      colic in the past 1 month, culminating in occlusion of common bile      duct and acute pancreatitis during this episode, and which may      subsequently have been passed.  This may account for dramatic drop      in lipase levels from over 2000, overnight to 369.  In addition,      the patient's LFTs at the time of presentation showed an alkaline      phosphatase of 68, ALT 102, AST of 94 and by September 27, 2008,      alkaline phosphatase was 54, AST 41, ALT 72.  LFTs have since      normalized, and on September 29, 2008, alkaline phosphatase was 45, AST      35, ALT 35.  These findings lend credence to above-mentioned theory      of a passed gallstone.  The patient's lipase profile was done, and      did not demonstrate significant hypertriglyceridemia.  Triglyceride      level was only 110.   1. Hepatic steatosis.  The patient had mildly abnormal LFTs at time of      presentation, raising the possibility of gallstone pancreatitis and      necessitating ordering a liver ultrasound scan, following his      abdominal CT scan.  This demonstrated some echogenicity of the      liver which was felt consistent with steatosis.  It is, however,      not felt that this was  contributory to abnormal LFTs, as these have      since normalized.   1. Dehydration.  The patient at the time of presentation, had a BUN of      18, creatinine 1.26 and appeared clinically quite dry.  He was      managed with aggressive intravenous fluid hydration and by September 29, 2008 BUN was 20, creatinine had improved at 0.99.  Intravenous      fluids have been reduced to avoid fluid overload.   1. Hypertension.  The patient's blood pressure remained normotensive      during the course of his hospitalization.  He was, however, managed      with Clonidine patch, as he was n.p.o. in the initial part of his      hospitalization.   1. Dyslipidemia.  As mentioned above, the patient's lipid profile was      done, and showed the following findings:  Total cholesterol 168,      triglycerides 110, HDL 40, LDL 106.  This appears to be a      reasonable profile.  The patient has been reassured accordingly.   1. GERD.  This was managed with proton pump inhibitor.   DISPOSITION:  This will be elucidated in detail at the appropriate time, by  discharging MD.  However, the patient has made significant strides on  the way to recovery, over the course of his hospitalization. As  mentioned above, lipase level was close to normal at 76 on September 29, 2008.  In addition, the patient's hydration status appeared satisfactory  and white cell count, which was significantly elevated at 20.1 on September 26, 2008, had by September 29, 2008, practically normalized at 11.0.  The  patient has had no episodes of pyrexia and no infectious source is  evident.  It is anticipated that over the next few days, we should be  able to advance the patient's diet and he will become sufficiently  clinically recovered, to be discharged.  Details will be elucidated in  addendum at the appropriate time, by discharging MD.      Sherryl Manges, M.D.  Electronically Signed     CO/MEDQ  D:  09/29/2008  T:  09/29/2008   Job:  [id number]   cc:   Gilford Rile, MD  Fax: [phone]

## 2010-11-01 NOTE — Discharge Summary (Signed)
NAME:  Benjamin Clayton, Benjamin Clayton NO.:  [id number]   MEDICAL RECORD NO.:  [id number]          PATIENT TYPE:  INP   LOCATION:  [id number]                         [facility]   PHYSICIAN:  Rise Patience, MDDATE OF BIRTH:  1945-06-26   DATE OF ADMISSION:  09/26/2008  DATE OF DISCHARGE:                               DISCHARGE SUMMARY   Please refer to the interim discharge summary dictated by Dr.  Rexene Edison September 29, 2008 for the course in the hospital and  procedures done prior to this discharge summary.  Subsequent to the last  discharge summary, the patient's condition gradually improved.  He was  able to tolerate diet, and the diet was advanced.  At this time, the  patient had good bowel movements.  He was ambulating without any help.  He is having no abdominal pain, tolerating a regular diet and is eager  to go home.  The patient will be discharged home.  The patient was  advised to get a CT of the abdomen and pelvis in one month's time with  contrast to make sure there is not any hidden mass for which they have  agreed.  At the time of this dictation, the patient is hemodynamically  stable.   FINAL DIAGNOSES:  1. Acute pancreatitis, etiology unclear.  2. Dehydration.  3. Hypertension.  4. Obesity.  5. Hyperglycemia.  6. Dyslipidemia.  7. Gastroesophageal reflux disease.   MEDICATION AT DISCHARGE:  1. Simvastatin 20 mg per daily.  2. Prilosec 20 mg per daily.  3. Norvasc 5 mg per daily.  4. Dulcolax 10 mg per rectal p.r.n. for constipation.  5. Oxycodone 5 mg p.o. q 6 p.r.n. for pain.   PROCEDURE AND LABS:  The patient's hemoglobin A1c on this admission was  found to be 5.4.  His lipase had been 352, had come to 23 at time of  discharge.   PLAN:  The patient was to follow with his primary care physician within  a week's time.  Recheck B met and LFT with particular importance to his  blood sugar.  To repeat a CT of abdomen and pelvis with contrast in  one  month's  time through his PCP to rule out any hidden mass in the pancreas.  At  this time, I will discontinue Lisinopril and place patient on Norvasc  __________ pancreatitis.  The patient is to be on a cardiac heart  healthy low fat diet.  In the event of any recurrence of symptoms, he is  advised to go to the nearest ER.      Rise Patience, MD  Electronically Signed     ANK/MEDQ  D:  10/02/2008  T:  10/02/2008  Job:  [id number]   cc:   Gilford Rile, MD  Fax: [phone]   Sherryl Manges, M.D.

## 2010-11-01 NOTE — Discharge Summary (Signed)
NAME:  Benjamin Clayton, Benjamin Clayton NO.:  [id number]   MEDICAL RECORD NO.:  [id number]          PATIENT TYPE:  INP   LOCATION:  59                         FACILITY:  [facility]   PHYSICIAN:  Edsel Petrin. Dalbert Batman, M.D.DATE OF BIRTH:  12-18-1945   DATE OF ADMISSION:  01/01/2009  DATE OF DISCHARGE:  01/14/2009                               DISCHARGE SUMMARY   FINAL DIAGNOSES:  1. Benign chronic pancreatic pseudocyst.  2. History of acute pancreatitis.  3. Transient type 2 diabetes.  4. Obesity.  5. Gastroesophageal reflux disease.  6. Chronic Cholecystitis with Cholelithiasis   OPERATIONS PERFORMED:  1. Exploratory laparotomy.  2. Intraoperative ultrasound.  3. Pancreatic cystogastrostomy.  4. Cholecystectomy.  5. Cholangiogram.   DATE OF SURGERY:  January 07, 2009.   HISTORY:  This is a 65 year old Caucasian male who does not drink  alcohol very much and had no history of gallstones.  In March 2010 he  had three episodes of abdominal pain which would last 2 hours and then  go away.  He was admitted to [facility] in April of 2010 and a  CT scan showed edematous pancreatitis, but imaging studies failed to  confirm any evidence of gallstones or biliary ductal dilatation.  He was  never jaundiced.  It was theorized that his pancreatitis might be due to  simvastatin or possibly his gallbladder.  He recovered from the acute  episode.  As an outpatient, he had some reflux symptoms, some belching  and bloating and some weight loss but was doing reasonably well.  He had  a CT scan at [facility] as an outpatient which showed a 19 cm x  11 cm pancreatic pseudocyst behind the stomach and the pancreas was  looking more atrophic at that point in time, although he was not  diabetic.  He was evaluated by Dr. Erskine Emery who then contacted me  and said that he felt the pseudocyst was chronic and we needed to  consider drainage of the pseudocyst and empiric cholecystectomy.   I  evaluated the patient as an outpatient and updated his CT scan which  confirmed the presence of a chronic pseudocyst and I felt that he would  need to have this surgery.  Before we could schedule his surgery, he  developed fever at home and some discomfort in his upper abdomen was  admitted to the hospital on the date of admission and placed on IV  antibiotics.  His white blood cell count was normal.  Liver function  tests and amylase and lipase were normal and he remained afebrile.  He  was started on hyperalimentation and plans were made for semi-elective  surgical management of his pseudocyst and cholecystectomy.  CT scan was  repeated and there was no evidence of infection or air bubbles.  There  is no evidence of ascites or leak.   During the preoperative evaluation the patient remained somewhat  uncomfortable but stable and whenever he would eat he would be  uncomfortable and I felt that it was probably a component of partial  obstruction, although not the point  of vomiting.   The patient was taken to the operating room on January 07, 2009.  He was  explored.  We could feel a large pseudocyst behind the stomach and also  presenting in the root of the transverse colon mesentery.  We elected to  drain the pseudocyst through the back wall of the stomach.  We opened  the anterior wall of the stomach.  We performed intraoperative  ultrasound through the back wall on the stomach.  We excised part of the  back wall of the stomach and the pseudocyst wall and created a large  window between the pseudocyst and the stomach.  We marsupialized the  edges of the opening with interrupted figure-of-eight sutures of 2-0  Vicryl.  We closed the anterior wall of the stomach.  We performed a  cholecystectomy and cholangiogram.  The cholangiogram showed dilated  ducts but no filling defect and no obstruction.   Final pathology report showed that the patient had chronic cholecystitis  and tiny  gallstones.  It showed that the wall of the pseudocyst showed  benign fibrotic tissue but no evidence of malignancy.   The patient did relatively well.  He had no evidence of bleeding.  He  had an ileus for 2 or 3 days and then we were able to remove his NG tube  and start him on a liquid diet.  He advanced the diet slowly and  steadily over the next several days.   Cultures of the pseudocyst fluid were negative and were, therefore, felt  to be sterile.  Pathology was benign as described above.  We  discontinued his antibiotics on postop day #3.   He had a little bit of fluid retention and lower extremity edema which  was very diffuse and nonpainful.  We cut his fluids back and gave him IV  Lasix and that did help.   He was discharged on July 29th.  At that time he was tolerating diet,  having bowel movements, wanted to go home, was looking good, capillary  blood glucoses were less than 140 without any intervention.   DISCHARGE MEDICATIONS:  1. Darvocet-N 100 for pain.  2. Protonix 40 mg daily.   He was advised not to drink any alcohol whatsoever.  He was asked to  return to see me in the office in 5-6 days for staple removal.  He was  told no sports or heavy lifting for 6 weeks.      Edsel Petrin. Dalbert Batman, M.D.  Electronically Signed     HMI/MEDQ  D:  01/25/2009  T:  01/25/2009  Job:  [id number]   cc:   Sandy Salaam. [name], MD,FACG  [address]  [place]  [place] [address]   Gilford Rile, MD  Fax: [phone]

## 2010-11-01 NOTE — Op Note (Signed)
NAME:  Benjamin Clayton, Benjamin Clayton NO.:  [id number]   MEDICAL RECORD NO.:  [id number]          PATIENT TYPE:  INP   LOCATION:  0098                         FACILITY:  [facility]   PHYSICIAN:  Edsel Petrin. Dalbert Batman, M.D.DATE OF BIRTH:  08-16-1945   DATE OF PROCEDURE:  01/07/2009  DATE OF DISCHARGE:                               OPERATIVE REPORT   PREOPERATIVE DIAGNOSES:  Pancreatic pseudocyst, history of acute  pancreatitis.   POSTOPERATIVE DIAGNOSES:  Pancreatic pseudocyst, history of acute  pancreatitis.   OPERATION PERFORMED:  Exploratory laparotomy, intraoperative ultrasound,  pancreatic cystogastrostomy, cholecystectomy, cholangiogram.   SURGEON:  Edsel Petrin. Dalbert Batman, M.D.   FIRST ASSISTANT:  Amber L. Zenia Resides, MD.   OPERATIVE INDICATIONS:  This is a 65 year old Caucasian male who does  not drink alcohol very much and has no known history of gallstones.  Starting in March of 2010, he had three episodes of nocturnal mid and  upper abdominal discomfort that would last for a couple of hours and go  away.  On September 26, 2008, he had a severe episode of pain and vomiting  and he was admitted to [facility].  CT scan showed edematous  pancreatitis, but CT and ultrasound failed to show any gallstones or  biliary ductal dilatation.  He had transient elevation of his  transaminases, but never had any jaundice or elevation of his bilirubin.  The patient states that the physicians theorized that might be his  simvastatin, but stated that was not the case and they also theorized  that it might be his gallbladder.  As an outpatient he did fairly well,  but was having some belching and bloating and reflux symptoms.  As an  outpatient he had a CT scan at [facility] which showed a 19 x 11  cm pancreatic pseudocyst behind the stomach,  anterior to the head,  neck, body and tail of the pancreas.  The pancreas appeared very  atrophic by this time.  He saw Sandy Salaam. [name], MD, [facility]  who called me  and said that he though the patient needed drainage of his pseudocyst  and consideration for cholecystectomy.  I saw the patient on July 1.  He  was asymptomatic at that time.  The CT scan was performed and confirmed  the presence of a chronic pseudocyst.  We scheduled him for surgery.  In  the interim, he developed some fever at home and some discomfort in his  upper abdomen and was admitted to the hospital about 6 days ago and  placed on IV antibiotics.  His lab work was normal, including liver  function test and amylase, lipase and white blood cell count and he  became was afebrile immediately.  He was started on hyperalimentation.  He continued to have some upper abdominal discomfort.  The CT scan  showed no evidence of any infection or abscess or air bubbles or  obstruction and this essentially looked like the CT scan from earlier in  the month.  Because he remained uncomfortable, I felt that it was  probably due to pressure from his enlarging pseudocyst  and we elected to  recommend surgery at this time which he agreed to.   OPERATIVE FINDINGS:  The patient had a huge pancreatic pseudocyst behind  the stomach and extending down on both the right and left side of the  midline behind the transverse colon mesentery.  We were able to drain  this into the back wall of the stomach.  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  The gallbladder was chronically thick-walled, also a little bit  edematous, but I did not feel any stones in the gallbladder.  The  cholangiogram showed a dilated biliary tree, but there was no filling  defect and the contrast flowed freely into the duodenum.  The colon and  small bowel and liver and spleen felt normal.  There was no ascites.  When we evacuated the pseudocyst, there was about 2000 mL of fluid which  initially was watery and grayish in color, but the more we took out the   more it had tannish color.  This was sent for cytology and culture.  We  also sent a piece of the wall of the pseudocyst for pathology.   OPERATIVE TECHNIQUE:  Following the induction of general endotracheal  anesthesia a nasogastric tube and Foley catheter was inserted.  The  abdomen was prepped and draped in a sterile fashion.  The patient was  identified as the correct patient and the correct procedure.  Intravenous antibiotics were given.  The upper midline incision was  made.  The fascia was incised in the midline.  The abdominal cavity was  entered under direct vision and explored with findings as described  above.   The self-retaining retractors were placed.  I spent some time  contemplating the technique of internal drainage.  I did not see any  sign of any infection, so I felt that proceeding with internal drainage  was appropriate.  After considering the pros and cons of technique, I  decided to perform a cystogastrostomy.  I took down the gastrocolic  omentum somewhat so I could see the posterior wall of the stomach which  was fused to the pancreatic pseudocyst.  I then selected a spot on the  anterior wall of the stomach about midway between the antrum and the  body.  With electrocautery I made a small opening and then opened the  hole using a GIA stapler until I had an opening probably about 7 or 8  cm.   I then used the ultrasound probe and I mapped out the pseudocyst.  It  actually turned out that the wall of the stomach and the pseudocyst wall  was least 1 cm in size.  The pseudocyst was quite large and extended  both to the right and left.  I used an 18 gauge needle and a 50 mL  syringe and aspirated about 30 mL of pseudocyst fluid to confirm that we  were in the pseudocyst.  I sent this for culture and cytology.  I then  opened up the hole with cautery and with a hemostat and evacuate 2000 mL  of fluid.  I could then insert my finger into the cavity of the   pseudocyst and decided where to go with the opening in the back wall of  the stomach.  I chose to go a little bit more proximally using a GIA  stapler with a green load of staples.  The stapler  was inserted, held in  place for 6 seconds, fired and removed.  It only partially cut the  tissues and so I cut the tissues further with electrocautery.  This  wound up being a hole that was about over 3 cm in diameter and I could  get three fingers in the hole.  The wall of the cystogastrostomy was  oversewn with multiple interrupted figure-of-eight sutures of 2-0  Vicryl.  After I had gone circumferentially and done this I did take a  piece of the wall of the pseudocyst and sent that for routine histology.  I felt in the back wall of the pseudocyst cavity that there may have  been a little bit of necrotic pancreas, but there was no odor.  I  irrigated out the pseudocyst cavity.  I irrigated out the stomach.  I  positioned the NG tube down into the distal antrum and then closed the  stomach in two layers.  The inner layer was a running locking suture of  2-0 Vicryl.  The outer layer was interrupted inverting sutures of 2-0  silk.  This closed the defect and the stomach wall quite nicely, but did  not narrow the lumen.  We irrigated out the subphrenic spaces and the  stomach and the infracolic area.  There was no bleeding.   We then repositioned our retractors.  We elevated the gallbladder with  Kelly clamps.  We incised the peritoneum over the gallbladder and took  it down from the fundus downward.  As we got down we found two branches  of the cystic artery which were individually clamped, divided and  ligated with metal clips.  When we got this dissected down to the cystic  duct we inserted a cholangiogram catheter into the cystic duct.  A  cholangiogram was obtained using the C-arm and showed dilated  intrahepatic and extrahepatic bile ducts, but no filling defect and no  obstruction.  The  anatomy was unremarkable.  I discussed the  cholangiogram with Carl Best, M.D. in radiology.  I removed the  cholangiogram catheter, secured the cystic duct with multiple metal  clips and divided it and then removed the gallbladder.  Hemostasis in  the bed of the gallbladder was excellent and achieved with  electrocautery.  We irrigated the subphrenic space and subhepatic space  and evacuated all the irrigation fluid.  Everything was dry.  I removed  all the laparotomy pads.  I inspected the pseudocyst area and the  gastrotomy area and everything looked clean.  There was no bleeding from  the NG tube.  The midline fascia was closed with a running suture of #1  double-stranded PDS.  The skin was closed with skin staples.  Clean  bandages were placed and the patient was taken to the recovery room in  stable condition.   ESTIMATED BLOOD LOSS:  About 300-400 mL.   COMPLICATIONS:  None.   SPONGE, NEEDLE AND INSTRUMENT COUNTS:  Correct.      Edsel Petrin. Dalbert Batman, M.D.  Electronically Signed     HMI/MEDQ  D:  01/07/2009  T:  01/07/2009  Job:  [id number]   cc:   Sandy Salaam. [name], MD,FACG  [address]  [place]  [place] [address]   Gilford Rile, MD  Fax: [phone]

## 2012-05-13 ENCOUNTER — Encounter: Payer: Self-pay | Admitting: Gastroenterology

## 2012-06-04 ENCOUNTER — Encounter: Payer: Self-pay | Admitting: Gastroenterology

## 2012-07-01 ENCOUNTER — Ambulatory Visit (AMBULATORY_SURGERY_CENTER): Payer: Medicare Other | Admitting: *Deleted

## 2012-07-01 VITALS — Ht 68.0 in | Wt 210.4 lb

## 2012-07-01 DIAGNOSIS — Z86010 Personal history of colonic polyps: Secondary | ICD-10-CM

## 2012-07-01 DIAGNOSIS — Z1211 Encounter for screening for malignant neoplasm of colon: Secondary | ICD-10-CM

## 2012-07-01 MED ORDER — NA SULFATE-K SULFATE-MG SULF 17.5-3.13-1.6 GM/177ML PO SOLN: ORAL | Status: DC

## 2012-07-15 ENCOUNTER — Ambulatory Visit (AMBULATORY_SURGERY_CENTER): Payer: Medicare Other | Admitting: Gastroenterology

## 2012-07-15 ENCOUNTER — Encounter: Payer: Self-pay | Admitting: Gastroenterology

## 2012-07-15 VITALS — BP 128/86 | HR 57 | Temp 97.2°F | Resp 19 | Ht 68.0 in | Wt 210.0 lb

## 2012-07-15 DIAGNOSIS — K5730 Diverticulosis of large intestine without perforation or abscess without bleeding: Secondary | ICD-10-CM

## 2012-07-15 DIAGNOSIS — Z1211 Encounter for screening for malignant neoplasm of colon: Secondary | ICD-10-CM

## 2012-07-15 DIAGNOSIS — D126 Benign neoplasm of colon, unspecified: Secondary | ICD-10-CM

## 2012-07-15 DIAGNOSIS — Z86010 Personal history of colonic polyps: Secondary | ICD-10-CM

## 2012-07-15 MED ORDER — SODIUM CHLORIDE 0.9 % IV SOLN: 500.0000 mL | INTRAVENOUS | Status: DC

## 2012-07-15 NOTE — Progress Notes (Signed)
Pt needs a Monday appt for EGD.  No Mondays available in February and March schedule not up yet.  Reminder put into computer for March 1st

## 2012-07-15 NOTE — Op Note (Signed)
[place]  [address] [place], [id number]   COLONOSCOPY PROCEDURE REPORT  PATIENT: Benjamin Clayton, Benjamin Clayton  MR#: [id number] BIRTHDATE: 1945-12-12 , 55  yrs. old GENDER: Male ENDOSCOPIST: Inda Castle, MD REFERRED [id number] Grisso, M.D. PROCEDURE DATE:  07/15/2012 PROCEDURE:   Colonoscopy with cold biopsy polypectomy ASA CLASS:   Class II INDICATIONS: MEDICATIONS: MAC sedation, administered by CRNA and propofol (Diprivan) 150mg  IV  DESCRIPTION OF PROCEDURE:   After the risks benefits and alternatives of the procedure were thoroughly explained, informed consent was obtained.  A digital rectal exam revealed no abnormalities of the rectum.   The LB CF-[id number]  endoscope was introduced through the anus and advanced to the cecum, which was identified by both the appendix and ileocecal valve. No adverse events experienced.   The quality of the prep was Suprep excellent The instrument was then slowly withdrawn as the colon was fully examined.      COLON FINDINGS: A sessile polyp measuring 2 mm in size was found in the distal descending colon.  A polypectomy was performed with cold forceps.  The resection was complete and the polyp tissue was completely retrieved.   Mild diverticulosis was noted in the sigmoid colon.   The colon mucosa was otherwise normal. Retroflexed views revealed no abnormalities. The time to cecum=2 minutes 22 seconds.  Withdrawal time=9 minutes 0 seconds.  The scope was withdrawn and the procedure completed. COMPLICATIONS: There were no complications.  ENDOSCOPIC IMPRESSION: 1.   Sessile polyp measuring 2 mm in size was found in the distal descending colon; polypectomy was performed with cold forceps 2.   Mild diverticulosis was noted in the sigmoid colon 3.   The colon mucosa was otherwise normal  RECOMMENDATIONS: If the polyp(s) removed today are proven to be adenomatous (pre-cancerous) polyps, you will need a repeat colonoscopy in  5 years.  Otherwise you should have a repeat colonoscopy in 7 years. You will receive a letter within 1-2 weeks with the results of your biopsy as well as final recommendations.  Please call my office if you have not received a letter after 3 weeks.   eSigned:  Inda Castle, MD 07/15/2012 9:01 AM   cc:

## 2012-07-15 NOTE — Progress Notes (Signed)
Patient did not experience any of the following events: a burn prior to discharge; a fall within the facility; wrong site/side/patient/procedure/implant event; or a hospital transfer or hospital admission upon discharge from the facility. (G8907) Patient did not have preoperative order for IV antibiotic SSI prophylaxis. (G8918)  

## 2012-07-15 NOTE — Progress Notes (Signed)
Called to room to assist during endoscopic procedure.  Patient ID and intended procedure confirmed with present staff. Received instructions for my participation in the procedure from the performing physician.  

## 2012-07-15 NOTE — Patient Instructions (Addendum)

## 2012-07-16 ENCOUNTER — Telehealth: Payer: Self-pay | Admitting: *Deleted

## 2012-07-16 NOTE — Telephone Encounter (Signed)
  Follow up Call-  Call back number 07/15/2012  Post procedure Call Back phone  # [phone]   or cell  [phone]  Permission to leave phone message Yes     Patient questions:  Do you have a fever, pain , or abdominal swelling? no Pain Score  0 *  Have you tolerated food without any problems? yes  Have you been able to return to your normal activities? yes  Do you have any questions about your discharge instructions: Diet   no Medications  no Follow up visit  no  Do you have questions or concerns about your Care? no  Actions: * If pain score is 4 or above: No action needed, pain <4.

## 2012-07-28 ENCOUNTER — Encounter: Payer: Self-pay | Admitting: Gastroenterology

## 2012-12-30 ENCOUNTER — Telehealth: Payer: Self-pay | Admitting: Gastroenterology

## 2012-12-30 NOTE — Telephone Encounter (Signed)
L/M for patient to return my call. Can not find where he needs a procedure scheduled

## 2013-01-01 NOTE — Telephone Encounter (Signed)
Dr [name], This patient called and wants an endoscopy for being on acid reflux meds for 30 years. Michela Pitcher you had told him you can have one in January

## 2013-01-01 NOTE — Telephone Encounter (Signed)
I would like to see him in the office first.

## 2013-01-02 NOTE — Telephone Encounter (Signed)
PATIENT HAS AN APPOINTMENT SCHEDULED FOR Monday 7/21 TO DISCUSS HAVING AN EGD

## 2013-01-06 ENCOUNTER — Ambulatory Visit (INDEPENDENT_AMBULATORY_CARE_PROVIDER_SITE_OTHER): Payer: Medicare Other | Admitting: Gastroenterology

## 2013-01-06 ENCOUNTER — Encounter: Payer: Self-pay | Admitting: Gastroenterology

## 2013-01-06 VITALS — BP 136/88 | HR 68 | Ht 68.0 in | Wt 204.4 lb

## 2013-01-06 DIAGNOSIS — R1310 Dysphagia, unspecified: Secondary | ICD-10-CM

## 2013-01-06 DIAGNOSIS — K219 Gastro-esophageal reflux disease without esophagitis: Secondary | ICD-10-CM

## 2013-01-06 DIAGNOSIS — R1314 Dysphagia, pharyngoesophageal phase: Secondary | ICD-10-CM | POA: Insufficient documentation

## 2013-01-06 DIAGNOSIS — Z860100 Personal history of colon polyps, unspecified: Secondary | ICD-10-CM | POA: Insufficient documentation

## 2013-01-06 NOTE — Assessment & Plan Note (Signed)
Rule out early esophageal stricture  Recommendations #1 upper endoscopy with dilatation as indicate

## 2013-01-06 NOTE — Assessment & Plan Note (Signed)
Patient has long-standing reflux which is PPI-dependent.  Recommendations #1 continue PPI therapy #2 upper endoscopy to rule out Barrett's esophagus

## 2013-01-06 NOTE — Assessment & Plan Note (Signed)
Plan followup colonoscopy 7-10 years

## 2013-01-06 NOTE — Progress Notes (Signed)
History of Present Illness: Pleasant 67 year old white male here for evaluation of reflux. 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 Should he miss a dose he develops pyrosis. He has occasional dysphagia to solids. He denies cough or sore throat.    Past Medical History  Diagnosis Date  . Diabetes mellitus without complication   . Hypertension   . GERD (gastroesophageal reflux disease)   . Migraines    Past Surgical History  Procedure Laterality Date  . Rotator cuff repair Bilateral   . Biceps tendon repair Left   . Knee arthroscopy Left   . Nasal septum surgery    . Cholecystectomy  2010    & Pancreatic Pseudocyst removed.now has incisional hernia   family history includes Diabetes in his sister; Esophageal cancer in his father; Heart attack in his paternal grandmother and sister; and Obesity in his sister.  There is no history of Colon cancer. Current Outpatient Prescriptions  Medication Sig Dispense Refill  . aspirin 81 MG tablet Take 81 mg by mouth daily.      . Cyanocobalamin (VITAMIN B 12 PO) Take 500 mg by mouth daily.      Marland Kitchen losartan (COZAAR) 50 MG tablet Take 50 mg by mouth daily.      . metFORMIN (GLUCOPHAGE) 500 MG tablet Take 1,000 mg by mouth 2 (two) times daily with a meal.      . Multiple Vitamin (MULTIVITAMIN) tablet Take 1 tablet by mouth daily.      Marland Kitchen omeprazole (PRILOSEC) 20 MG capsule Take 20 mg by mouth daily.      Marland Kitchen topiramate (TOPAMAX) 25 MG tablet Take 25 mg by mouth 2 (two) times daily.      Marland Kitchen zolpidem (AMBIEN) 10 MG tablet Take 10 mg by mouth at bedtime as needed.       No current facility-administered medications for this visit.   Allergies as of 01/06/2013 - Review Complete 01/06/2013  Allergen Reaction Noted  . Statins Other (See Comments) 07/01/2012    reports that he quit smoking about 30 years ago. His smoking use included Cigarettes. He smoked 0.00 packs per day. He has never used smokeless tobacco. He  reports that he does not drink alcohol or use illicit drugs.     Review of Systems: Pertinent positive and negative review of systems were noted in the above HPI section. All other review of systems were otherwise negative.  Vital signs were reviewed in today's medical record Physical Exam: General: Well developed , well nourished, no acute distress Skin: anicteric Head: Normocephalic and atraumatic Eyes:  sclerae anicteric, EOMI Ears: Normal auditory acuity Mouth: No deformity or lesions Neck: Supple, no masses or thyromegaly Lungs: Clear throughout to auscultation Heart: Regular rate and rhythm; no murmurs, rubs or bruits Abdomen: Soft, non tender and non distended. No masses, hepatosplenomegaly or hernias noted. Normal Bowel sounds. There is a reducible incisional hernia Rectal:deferred Musculoskeletal: Symmetrical with no gross deformities  Skin: No lesions on visible extremities Pulses:  Normal pulses noted Extremities: No clubbing, cyanosis, edema or deformities noted Neurological: Alert oriented x 4, grossly nonfocal Cervical Nodes:  No significant cervical adenopathy Inguinal Nodes: No significant inguinal adenopathy Psychological:  Alert and cooperative. Normal mood and affect

## 2013-01-06 NOTE — Patient Instructions (Addendum)

## 2013-01-22 ENCOUNTER — Other Ambulatory Visit: Payer: Self-pay

## 2013-01-28 ENCOUNTER — Encounter: Payer: Self-pay | Admitting: Gastroenterology

## 2013-01-28 ENCOUNTER — Ambulatory Visit (AMBULATORY_SURGERY_CENTER): Payer: Medicare Other | Admitting: Gastroenterology

## 2013-01-28 VITALS — BP 115/85 | HR 59 | Temp 97.9°F | Resp 23 | Ht 68.0 in | Wt 204.0 lb

## 2013-01-28 DIAGNOSIS — K219 Gastro-esophageal reflux disease without esophagitis: Secondary | ICD-10-CM

## 2013-01-28 DIAGNOSIS — R1310 Dysphagia, unspecified: Secondary | ICD-10-CM

## 2013-01-28 HISTORY — PX: ESOPHAGOGASTRODUODENOSCOPY (EGD) WITH ESOPHAGEAL DILATION: SHX5812

## 2013-01-28 MED ORDER — SODIUM CHLORIDE 0.9 % IV SOLN: 500.0000 mL | INTRAVENOUS | Status: DC

## 2013-01-28 NOTE — Patient Instructions (Addendum)
YOU HAD AN ENDOSCOPIC PROCEDURE TODAY AT [facility]: Refer to the procedure report that was given to you for any specific questions about what was found during the examination.  If the procedure report does not answer your questions, please call your gastroenterologist to clarify.  If you requested that your care partner not be given the details of your procedure findings, then the procedure report has been included in a sealed envelope for you to review at your convenience later.  YOU SHOULD EXPECT: Some feelings of bloating in the abdomen. Passage of more gas than usual.  Walking can help get rid of the air that was put into your GI tract during the procedure and reduce the bloating. If you had a lower endoscopy (such as a colonoscopy or flexible sigmoidoscopy) you may notice spotting of blood in your stool or on the toilet paper. If you underwent a bowel prep for your procedure, then you may not have a normal bowel movement for a few days.  DIET:   You may have clear liquids at 4pm.    You do need to stay on a soft diet for the rest of today.  Tomorrow you may return to a regular diet..  Drink plenty of fluids but you should avoid alcoholic beverages for 24 hours.  ACTIVITY: Your care partner should take you home directly after the procedure.  You should plan to take it easy, moving slowly for the rest of the day.  You can resume normal activity the day after the procedure however you should NOT DRIVE or use heavy machinery for 24 hours (because of the sedation medicines used during the test).    BE SURE TO TAKE YOUR REFLUX MEDICINE TO PREVENT BAD CHANGES IN YOUR ESOPHAGUS.  SYMPTOMS TO REPORT IMMEDIATELY: A gastroenterologist can be reached at any hour.  During normal business hours, 8:30 AM to 5:00 PM Monday through Friday, call [phone].  After hours and on weekends, please call the GI answering service at [phone] who will take a message and have the physician on call  contact you.   Following upper endoscopy (EGD)  Vomiting of blood or coffee ground material  New chest pain or pain under the shoulder blades  Painful or persistently difficult swallowing  New shortness of breath  Fever of 100F or higher  Black, tarry-looking stools  FOLLOW UP: If any biopsies were taken you will be contacted by phone or by letter within the next 1-3 weeks.  Call your gastroenterologist if you have not heard about the biopsies in 3 weeks.  Our staff will call the home number listed on your records the next business day following your procedure to check on you and address any questions or concerns that you may have at that time regarding the information given to you following your procedure. This is a courtesy call and so if there is no answer at the home number and we have not heard from you through the emergency physician on call, we will assume that you have returned to your regular daily activities without incident.  SIGNATURES/CONFIDENTIALITY: You and/or your care partner have signed paperwork which will be entered into your electronic medical record.  These signatures attest to the fact that that the information above on your After Visit Summary has been reviewed and is understood.  Full responsibility of the confidentiality of this discharge information lies with you and/or your care-partner.

## 2013-01-28 NOTE — Progress Notes (Addendum)
Patient did not have preoperative order for IV antibiotic SSI prophylaxis. (G8918)  Patient did not experience any of the following events: a burn prior to discharge; a fall within the facility; wrong site/side/patient/procedure/implant event; or a hospital transfer or hospital admission upon discharge from the facility. (G8907)  

## 2013-01-28 NOTE — Progress Notes (Signed)
Called to room to assist during endoscopic procedure.  Patient ID and intended procedure confirmed with present staff. Received instructions for my participation in the procedure from the performing physician.  

## 2013-01-28 NOTE — Op Note (Signed)
[facility]  [address] [place], [id number]   ENDOSCOPY PROCEDURE REPORT  PATIENT: Benjamin Clayton, Benjamin Clayton  MR#: [id number] BIRTHDATE: 1946/02/10 , 65  yrs. old GENDER: Male ENDOSCOPIST: Inda Castle, MD REFERRED BY:  Gilford Rile, M.D. PROCEDURE DATE:  01/28/2013 PROCEDURE:  EGD w/ biopsy and Maloney dilation of esophagus ASA CLASS:     Class II INDICATIONS:  Dysphagia.   Heartburn. MEDICATIONS: MAC sedation, administered by CRNA and propofol (Diprivan) 100mg  IV TOPICAL ANESTHETIC:  DESCRIPTION OF PROCEDURE: After the risks benefits and alternatives of the procedure were thoroughly explained, informed consent was obtained.  The LB [id number] endoscope was introduced through the mouth and advanced to the third portion of the duodenum. Without limitations.  The instrument was slowly withdrawn as the mucosa was fully examined.      The GE junction was very irregular with islands of gastric appearing mucosa extending proximally for approximately 2 cm.  Multiple biopsies were taken to rule out Barrett's esophagus.   The remainder of the upper endoscopy exam was otherwise normal. The scope was then withdrawn from the patient and the procedure completed.  Because of complaints of dysphagia.  A #52 Isabell Jarvis dilator was passed.  There was mild resistance.  There was a small amount of heme, however, this followed biopsies at the GE junction and probably represented minimal bleeding from the biopsies.  COMPLICATIONS: There were no complications.  ENDOSCOPIC IMPRESSION: 1.  Barrett's esophagus 2.  dysphagia without obvious stricture-status post Maloney dilation  RECOMMENDATIONS: 1.  Await biopsy results 2.  continue omeprazole REPEAT EXAM:  eSigned:  Inda Castle, MD 01/28/2013 3:18 PM   CC:

## 2013-01-29 ENCOUNTER — Telehealth: Payer: Self-pay | Admitting: *Deleted

## 2013-01-29 NOTE — Telephone Encounter (Signed)
  Follow up Call-  Call back number 01/28/2013 07/15/2012  Post procedure Call Back phone  # cell [phone]   or cell  [phone]  Permission to leave phone message Yes Yes     Patient questions:  Do you have a fever, pain , or abdominal swelling? no Pain Score  0 *  Have you tolerated food without any problems? yes  Have you been able to return to your normal activities? yes  Do you have any questions about your discharge instructions: Diet   no Medications  no Follow up visit  no  Do you have questions or concerns about your Care? no  Actions: * If pain score is 4 or above: No action needed, pain <4.

## 2013-02-13 ENCOUNTER — Encounter: Payer: Self-pay | Admitting: Gastroenterology

## 2013-04-24 ENCOUNTER — Other Ambulatory Visit: Payer: Self-pay

## 2013-06-04 ENCOUNTER — Telehealth: Payer: Self-pay | Admitting: Gastroenterology

## 2013-06-09 MED ORDER — OMEPRAZOLE 20 MG PO CPDR: 20.0000 mg | DELAYED_RELEASE_CAPSULE | Freq: Every day | ORAL | Status: DC

## 2013-06-09 NOTE — Telephone Encounter (Signed)
SENT [facility] SCRIPT TO [facility]. PT AWARE

## 2014-04-03 ENCOUNTER — Other Ambulatory Visit: Payer: Self-pay

## 2014-05-05 ENCOUNTER — Other Ambulatory Visit: Payer: Self-pay | Admitting: Gastroenterology

## 2014-06-24 ENCOUNTER — Other Ambulatory Visit: Payer: Self-pay | Admitting: Urology

## 2014-06-30 ENCOUNTER — Encounter (HOSPITAL_BASED_OUTPATIENT_CLINIC_OR_DEPARTMENT_OTHER): Payer: Self-pay | Admitting: *Deleted

## 2014-06-30 NOTE — Progress Notes (Signed)
NPO AFTER MN. ARRIVE AT 1000. NEEDS ISTAT AND EKG. WILL TAKE TOPAMAX AND COZAAR AM DOS W/ SIPS OF WATER.

## 2014-06-30 NOTE — Progress Notes (Signed)
   06/30/14 1002  OBSTRUCTIVE SLEEP APNEA  Have you ever been diagnosed with sleep apnea through a sleep study? No  Do you snore loudly (loud enough to be heard through closed doors)?  0  Do you often feel tired, fatigued, or sleepy during the daytime? 0  Has anyone observed you stop breathing during your sleep? 0  Do you have, or are you being treated for high blood pressure? 1  BMI more than 35 kg/m2? 0  Age over 70 years old? 1  Neck circumference greater than 40 cm/16 inches? 1  Gender: 1  Obstructive Sleep Apnea Score 4  Score 4 or greater  Results sent to PCP

## 2014-07-02 NOTE — H&P (Signed)
Reason For Visit phimosis   History of Present Illness 69M who was seen 1 yr ago by Dr. Janice Norrie for balanitis/phimosis. He was complaining of ulcerative lesions on his foreskin following intercourse. His exam was relatively normal and he was given Myolog to help with the balanitis. He presents today for reconsideration of circumcision.  The patient did not use a steroid cream that he was given at his last visit because he is diabetic and is afraid to use steroid creams which may compromise his immune system. The patient states that he continues to have skin tear with intercourse. He is circumcised. He is unable to reduce his foreskin. This is been present for 5 or 6 years.   Past Medical History Problems  1. History of Arthritis 2. History of diabetes mellitus (Z86.39) 3. History of heartburn (Z87.898) 4. History of hypertension (Z86.79)  Surgical History Problems  1. History of Cholecystectomy 2. History of Knee Surgery 3. History of Resection Of Long Biceps Tendon 4. History of Shoulder Surgery 5. History of Shoulder Surgery  Current Meds 1. Ambien TABS;  Therapy: (Recorded:20Jan2014) to Recorded 2. Aspirin 81 MG Oral Tablet;  Therapy: (Recorded:20Jan2014) to Recorded 3. Cialis 20 MG Oral Tablet; TAKE 20 MG After meals PRN;  Therapy: [id number] to (Evaluate:15Jan2015)  Requested for: 20Jan2014; Last  Rx:20Jan2014 Ordered 4. Losartan Potassium 50 MG Oral Tablet;  Therapy: (Recorded:20Jan2014) to Recorded 5. MetFORMIN HCl - 500 MG Oral Tablet;  Therapy: (Recorded:20Jan2014) to Recorded 6. Multi-Vitamin TABS;  Therapy: (Recorded:20Jan2014) to Recorded 7. Nystatin-Triamcinolone 100000-0.1 UNIT/GM-% External Cream; APPLY SPARINGLY TO  AFFECTED AREA(S) 3 TIMES A DAY;  Therapy: [id number] to (Evaluate:21Jan2014)  Requested for: 20Jan2014; Last  Rx:20Jan2014 Ordered 8. PriLOSEC 20 MG Oral Capsule Delayed Release;  Therapy: (Recorded:20Jan2014) to Recorded 9. Vitamin B-12 TABS;  Therapy: (Recorded:20Jan2014) to Recorded  Allergies Medication  1. Statins  Family History Problems  1. Family history of Family Health Status Number Of Children   2 sons 2. Family history of Father Deceased At Age 48   throat ca 3. Family history of Mother Deceased At Age 27   Accident  Social History Problems  1. Denied: History of Alcohol Use 2. Caffeine Use   2 3. Marital History - Currently Married 4. Retired From Work 5. Tobacco use (Z72.0)   1 pk for 15 yrs quit 28 yrs ago  Review of Systems No changes in pts bowel habits, neurological changes, or progressive lower urinary tract symptoms.    Vitals Vital Signs [Data Includes: Last 1 Day]  Recorded: 31Dec2015 12:54PM  Weight: 197 lb  BMI Calculated: 29.95 BSA Calculated: 2.03 Blood Pressure: 124 / 78 Heart Rate: 64  Physical Exam The patient has a paraphimosis without significant glandular edema. He has a thin band on the foreskin preventing it from being reduced over the glans.   Results/Data Urine [Data Includes: Last 1 Day]   [id number]  COLOR YELLOW   APPEARANCE CLEAR   SPECIFIC GRAVITY 1.025   pH 5.5   GLUCOSE NEG mg/dL  BILIRUBIN NEG   KETONE NEG mg/dL  BLOOD NEG   PROTEIN NEG mg/dL  UROBILINOGEN 0.2 mg/dL  NITRITE NEG   LEUKOCYTE ESTERASE NEG    Assessment Assessed  1. Balanitis (N48.1) 2. History of phimosis (Z87.438) 3. Paraphimosis (N47.2)  Paraphimosis   Plan Health Maintenance  1. UA With REFLEX; [Do Not Release]; Status:Resulted - Requires Verification;   [id number] 12:39PM Paraphimosis  2. Follow-up Schedule Surgery Office  Follow-up  Status: Complete  Done: [id number]  Discussion/Summary I reiterated the conservative therapy to include steroid creams. I then explained circumcision in detail. I recommended initially that we try the cream and have the patient follow up in 6 weeks. However, the patient is eager to proceed with circumcision. We discussed the risks and  benefits of this including infection, undesired results, and pain. We will plan to get this done as soon as the patient is able to get scheduled.

## 2014-07-03 ENCOUNTER — Ambulatory Visit (HOSPITAL_BASED_OUTPATIENT_CLINIC_OR_DEPARTMENT_OTHER): Payer: Medicare Other | Admitting: Anesthesiology

## 2014-07-03 ENCOUNTER — Encounter (HOSPITAL_BASED_OUTPATIENT_CLINIC_OR_DEPARTMENT_OTHER)
Admission: RE | Disposition: A | Discharge status: Home / Self Care | Payer: Self-pay | Source: Ambulatory Visit | Attending: Urology

## 2014-07-03 ENCOUNTER — Ambulatory Visit (HOSPITAL_BASED_OUTPATIENT_CLINIC_OR_DEPARTMENT_OTHER)
Admission: RE | Admit: 2014-07-03 | Discharge: 2014-07-03 | Disposition: A | Discharge status: Home / Self Care | Payer: Medicare Other | Source: Ambulatory Visit | Attending: Urology | Admitting: Urology

## 2014-07-03 ENCOUNTER — Other Ambulatory Visit: Payer: Self-pay

## 2014-07-03 ENCOUNTER — Encounter (HOSPITAL_BASED_OUTPATIENT_CLINIC_OR_DEPARTMENT_OTHER): Payer: Self-pay | Admitting: *Deleted

## 2014-07-03 DIAGNOSIS — Z800 Family history of malignant neoplasm of digestive organs: Secondary | ICD-10-CM | POA: Diagnosis not present

## 2014-07-03 DIAGNOSIS — Z87891 Personal history of nicotine dependence: Secondary | ICD-10-CM | POA: Diagnosis not present

## 2014-07-03 DIAGNOSIS — K5790 Diverticulosis of intestine, part unspecified, without perforation or abscess without bleeding: Secondary | ICD-10-CM | POA: Insufficient documentation

## 2014-07-03 DIAGNOSIS — E119 Type 2 diabetes mellitus without complications: Secondary | ICD-10-CM | POA: Insufficient documentation

## 2014-07-03 DIAGNOSIS — N472 Paraphimosis: Secondary | ICD-10-CM | POA: Insufficient documentation

## 2014-07-03 DIAGNOSIS — Z87898 Personal history of other specified conditions: Secondary | ICD-10-CM | POA: Insufficient documentation

## 2014-07-03 DIAGNOSIS — Z833 Family history of diabetes mellitus: Secondary | ICD-10-CM | POA: Diagnosis not present

## 2014-07-03 DIAGNOSIS — I4510 Unspecified right bundle-branch block: Secondary | ICD-10-CM | POA: Insufficient documentation

## 2014-07-03 DIAGNOSIS — I10 Essential (primary) hypertension: Secondary | ICD-10-CM | POA: Diagnosis not present

## 2014-07-03 DIAGNOSIS — Z8679 Personal history of other diseases of the circulatory system: Secondary | ICD-10-CM | POA: Insufficient documentation

## 2014-07-03 DIAGNOSIS — N471 Phimosis: Secondary | ICD-10-CM | POA: Diagnosis present

## 2014-07-03 HISTORY — DX: Diverticulosis of large intestine without perforation or abscess without bleeding: K57.30

## 2014-07-03 HISTORY — DX: Personal history of other diseases of the digestive system: Z87.19

## 2014-07-03 HISTORY — DX: Personal history of colon polyps, unspecified: Z86.0100

## 2014-07-03 HISTORY — PX: CIRCUMCISION: SHX1350

## 2014-07-03 HISTORY — DX: Other specified personal risk factors, not elsewhere classified: Z91.89

## 2014-07-03 HISTORY — DX: Other specified postprocedural states: Z98.890

## 2014-07-03 HISTORY — DX: Unspecified osteoarthritis, unspecified site: M19.90

## 2014-07-03 HISTORY — DX: Barrett's esophagus without dysplasia: K22.70

## 2014-07-03 HISTORY — DX: Personal history of colonic polyps: Z86.010

## 2014-07-03 HISTORY — DX: Phimosis: N47.1

## 2014-07-03 HISTORY — DX: Type 2 diabetes mellitus without complications: E11.9

## 2014-07-03 HISTORY — DX: Presence of spectacles and contact lenses: Z97.3

## 2014-07-03 LAB — POCT I-STAT 4, (NA,K, GLUC, HGB,HCT)
GLUCOSE: 134 mg/dL — AB (ref 70–99)
HCT: 42 % (ref 39.0–52.0)
HEMOGLOBIN: 14.3 g/dL (ref 13.0–17.0)
Potassium: 4.2 mmol/L (ref 3.5–5.1)
SODIUM: 144 mmol/L (ref 135–145)

## 2014-07-03 SURGERY — CIRCUMCISION, ADULT
Anesthesia: Monitor Anesthesia Care | Site: Penis

## 2014-07-03 MED ORDER — ACETAMINOPHEN-CODEINE #3 300-30 MG PO TABS: 1.0000 | ORAL_TABLET | ORAL | Status: AC | PRN

## 2014-07-03 MED ORDER — PROPOFOL 10 MG/ML IV EMUL
INTRAVENOUS | Status: DC | PRN
  Administered 2014-07-03: 50 ug/kg/min via INTRAVENOUS

## 2014-07-03 MED ORDER — 0.9 % SODIUM CHLORIDE (POUR BTL) OPTIME
TOPICAL | Status: DC | PRN
  Administered 2014-07-03: 100 mL

## 2014-07-03 MED ORDER — CEFAZOLIN SODIUM-DEXTROSE 2-3 GM-% IV SOLR
2.0000 g | INTRAVENOUS | Status: AC
  Administered 2014-07-03: 2 g via INTRAVENOUS
  Filled 2014-07-03: qty 50

## 2014-07-03 MED ORDER — LACTATED RINGERS IV SOLN
INTRAVENOUS | Status: DC
  Administered 2014-07-03: 11:00:00 via INTRAVENOUS
  Filled 2014-07-03: qty 1000

## 2014-07-03 MED ORDER — ACETAMINOPHEN 10 MG/ML IV SOLN
INTRAVENOUS | Status: DC | PRN
  Administered 2014-07-03: 1000 mg via INTRAVENOUS

## 2014-07-03 MED ORDER — LIDOCAINE HCL 1 % IJ SOLN: INTRAMUSCULAR | Status: DC | PRN

## 2014-07-03 MED ORDER — KETOROLAC TROMETHAMINE 30 MG/ML IJ SOLN
INTRAMUSCULAR | Status: DC | PRN
  Administered 2014-07-03: 15 mg via INTRAVENOUS

## 2014-07-03 MED ORDER — BACITRACIN ZINC 500 UNIT/GM EX OINT
TOPICAL_OINTMENT | CUTANEOUS | Status: DC | PRN
  Administered 2014-07-03: 1 via TOPICAL

## 2014-07-03 MED ORDER — MIDAZOLAM HCL 5 MG/5ML IJ SOLN
INTRAMUSCULAR | Status: DC | PRN
  Administered 2014-07-03: 2 mg via INTRAVENOUS

## 2014-07-03 MED ORDER — BUPIVACAINE HCL (PF) 0.25 % IJ SOLN
INTRAMUSCULAR | Status: DC | PRN
  Administered 2014-07-03: 20 mL

## 2014-07-03 MED ORDER — MIDAZOLAM HCL 2 MG/2ML IJ SOLN
INTRAMUSCULAR | Status: AC
  Filled 2014-07-03: qty 2

## 2014-07-03 MED ORDER — CEFAZOLIN SODIUM-DEXTROSE 2-3 GM-% IV SOLR
INTRAVENOUS | Status: AC
  Filled 2014-07-03: qty 50

## 2014-07-03 MED ORDER — FENTANYL CITRATE 0.05 MG/ML IJ SOLN
INTRAMUSCULAR | Status: DC | PRN
  Administered 2014-07-03: 100 ug via INTRAVENOUS

## 2014-07-03 MED ORDER — PROPOFOL 10 MG/ML IV BOLUS
INTRAVENOUS | Status: DC | PRN
  Administered 2014-07-03: 50 mg via INTRAVENOUS

## 2014-07-03 MED ORDER — FENTANYL CITRATE 0.05 MG/ML IJ SOLN
INTRAMUSCULAR | Status: AC
  Filled 2014-07-03: qty 4

## 2014-07-03 SURGICAL SUPPLY — 38 items
BANDAGE COBAN STERILE 2 (GAUZE/BANDAGES/DRESSINGS) ×1 IMPLANT
BLADE SURG 15 STRL LF DISP TIS (BLADE) ×1 IMPLANT
BLADE SURG 15 STRL SS (BLADE) ×2
BNDG CONFORM 2 STRL LF (GAUZE/BANDAGES/DRESSINGS) ×2 IMPLANT
COVER MAYO STAND STRL (DRAPES) ×2 IMPLANT
COVER TABLE BACK 60X90 (DRAPES) ×2 IMPLANT
DECANTER SPIKE VIAL GLASS SM (MISCELLANEOUS) IMPLANT
DRAPE PED LAPAROTOMY (DRAPES) ×2 IMPLANT
ELECT NDL TIP 2.8 STRL (NEEDLE) ×1 IMPLANT
ELECT NEEDLE TIP 2.8 STRL (NEEDLE) ×2 IMPLANT
ELECT REM PT RETURN 9FT ADLT (ELECTROSURGICAL) ×2
ELECTRODE REM PT RTRN 9FT ADLT (ELECTROSURGICAL) ×1 IMPLANT
GAUZE SPONGE 4X4 16PLY XRAY LF (GAUZE/BANDAGES/DRESSINGS) ×2 IMPLANT
GAUZE XEROFORM 1X8 LF (GAUZE/BANDAGES/DRESSINGS) ×2 IMPLANT
GAUZE XEROFORM 5X9 LF (GAUZE/BANDAGES/DRESSINGS) ×2 IMPLANT
GLOVE BIO SURGEON STRL SZ 6.5 (GLOVE) ×2 IMPLANT
GLOVE BIO SURGEON STRL SZ7.5 (GLOVE) ×2 IMPLANT
GLOVE INDICATOR 6.5 STRL GRN (GLOVE) ×2 IMPLANT
GOWN STRL REIN XL XLG (GOWN DISPOSABLE) ×2 IMPLANT
GOWN STRL REUS W/TWL LRG LVL3 (GOWN DISPOSABLE) ×2 IMPLANT
GOWN STRL REUS W/TWL XL LVL3 (GOWN DISPOSABLE) ×1 IMPLANT
NDL HYPO 25X1 1.5 SAFETY (NEEDLE) IMPLANT
NEEDLE HYPO 25X1 1.5 SAFETY (NEEDLE) IMPLANT
NS IRRIG 500ML POUR BTL (IV SOLUTION) ×1 IMPLANT
PACK BASIN DAY SURGERY FS (CUSTOM PROCEDURE TRAY) ×2 IMPLANT
PENCIL BUTTON HOLSTER BLD 10FT (ELECTRODE) ×2 IMPLANT
SPONGE GAUZE 4X4 12PLY STER LF (GAUZE/BANDAGES/DRESSINGS) ×2 IMPLANT
SUT CHROMIC 3 0 SH 27 (SUTURE) ×2 IMPLANT
SUT CHROMIC 4 0 SH 27 (SUTURE) ×1 IMPLANT
SUT VIC AB 3-0 SH 27 (SUTURE) ×2
SUT VIC AB 3-0 SH 27X BRD (SUTURE) ×1 IMPLANT
SUT VIC AB 4-0 RB1 27 (SUTURE) ×4
SUT VIC AB 4-0 RB1 27X BRD (SUTURE) ×2 IMPLANT
SYR CONTROL 10ML LL (SYRINGE) IMPLANT
TOWEL OR 17X26 10 PK STRL BLUE (TOWEL DISPOSABLE) ×2 IMPLANT
TOWEL OR NON WOVEN STRL DISP B (DISPOSABLE) ×2 IMPLANT
TRAY DSU PREP LF (CUSTOM PROCEDURE TRAY) ×2 IMPLANT
WATER STERILE IRR 500ML POUR (IV SOLUTION) IMPLANT

## 2014-07-03 NOTE — Op Note (Signed)
Preoperative diagnosis:  1. Paraphimosis   Postoperative diagnosis:  1. same   Procedure: 1. Circumcision  Surgeon: Ardis Hughs, MD  Anesthesia: MAC with local dorsal nerve block  Complications: None  Intraoperative findings: slight phimotic band  EBL: Minimal  Specimens: Foreskin  Indication: Benjamin Clayton is a 69 y.o. patient with paraphimosis and irritation.  After reviewing the management options for treatment, he elected to proceed with the above surgical procedure(s). We have discussed the potential benefits and risks of the procedure, side effects of the proposed treatment, the likelihood of the patient achieving the goals of the procedure, and any potential problems that might occur during the procedure or recuperation. Informed consent has been obtained.  Description of procedure:  After MAC anesthesia was induced the patient was prepped and draped in the routine sterile fashion. A penile block was then performed with local anesthetic. The foreskin was then opened enough to allow retraction over the glans penis. The prepuce was then marked so as to provide a proximately 5 mm collar.  The foreskin was then reduced over the glans penis and marked so as to allow a nice fit for the reanastomosis of the skin to the collar.  A 15 blade was then used to make a circumferential incision through the dermis of both marks. A pair of Metzenbaum scissors was then tunneled through the incisions and the foreskin opened on top of the scissors using electrocautery. The foreskin was then removed circumferentially. The underlying tissue was then cauterized and all bleeding stopped.  The penile skin was then approximated to the collar in 4 quadrants using a 3-0 Chromic. The frenulum was attached with a U stitch in the dorsal surface. The remaining skin was then closed in a running baseball stitch in 4 separate sutures.  Vaseline impregnated dressing was then applied to the suture line in  the penis was wrapped with 2 inch Kerlix dressing gently. The patient was then given fluff dressing and a pair of mesh underpants. The patient was subsequently awoken and returned to PACU in excellent condition. There no immediate complications.

## 2014-07-03 NOTE — Transfer of Care (Signed)
Immediate Anesthesia Transfer of Care Note  Patient: Benjamin Clayton  Procedure(s) Performed: Procedure(s): CIRCUMCISION ADULT (N/A)  Patient Location: PACU and Short Stay  Anesthesia Type:MAC  Level of Consciousness: awake, alert  and oriented  Airway & Oxygen Therapy: Patient Spontanous Breathing  Post-op Assessment: Report given to PACU RN  Post vital signs: Reviewed and stable  Complications: No apparent anesthesia complications

## 2014-07-03 NOTE — Anesthesia Preprocedure Evaluation (Signed)
Anesthesia Evaluation  Patient identified by MRN, date of birth, ID band Patient awake    Reviewed: Allergy & Precautions, NPO status , Patient's Chart, lab work & pertinent test results  Airway Mallampati: II  TM Distance: >3 FB Neck ROM: Full    Dental no notable dental hx.    Pulmonary former smoker,  breath sounds clear to auscultation  Pulmonary exam normal       Cardiovascular hypertension, Pt. on medications Rhythm:Regular Rate:Normal     Neuro/Psych  Headaches,  Neuromuscular disease negative psych ROS   GI/Hepatic Neg liver ROS, GERD-  Medicated,  Endo/Other  diabetes, Poorly Controlled, Type 2, Oral Hypoglycemic Agents  Renal/GU negative Renal ROS  negative genitourinary   Musculoskeletal  (+) Arthritis -,   Abdominal   Peds negative pediatric ROS (+)  Hematology negative hematology ROS (+)   Anesthesia Other Findings   Reproductive/Obstetrics negative OB ROS                             Anesthesia Physical Anesthesia Plan  ASA: III  Anesthesia Plan: MAC   Post-op Pain Management:    Induction: Intravenous  Airway Management Planned:   Additional Equipment:   Intra-op Plan:   Post-operative Plan: Extubation in OR  Informed Consent: I have reviewed the patients History and Physical, chart, labs and discussed the procedure including the risks, benefits and alternatives for the proposed anesthesia with the patient or authorized representative who has indicated his/her understanding and acceptance.   Dental advisory given  Plan Discussed with: CRNA  Anesthesia Plan Comments: (Discussed general and MAC. He prefers MAC.)        Anesthesia Quick Evaluation

## 2014-07-03 NOTE — Anesthesia Postprocedure Evaluation (Signed)
  Anesthesia Post-op Note  Patient: Benjamin Clayton  Procedure(s) Performed: Procedure(s) (LRB): CIRCUMCISION ADULT (N/A)  Patient Location: PACU  Anesthesia Type: MAC  Level of Consciousness: awake and alert   Airway and Oxygen Therapy: Patient Spontanous Breathing  Post-op Pain: mild  Post-op Assessment: Post-op Vital signs reviewed, Patient's Cardiovascular Status Stable, Respiratory Function Stable, Patent Airway and No signs of Nausea or vomiting  Last Vitals:  Filed Vitals:   07/03/14 1300  BP: 96/68  Pulse: 58  Temp: 36.3 C  Resp:     Post-op Vital Signs: stable   Complications: No apparent anesthesia complications

## 2014-07-03 NOTE — Interval H&P Note (Signed)
History and Physical Interval Note:  07/03/2014 11:39 AM  Benjamin Clayton  has presented today for surgery, with the diagnosis of PHIMOSIS  The various methods of treatment have been discussed with the patient and family. After consideration of risks, benefits and other options for treatment, the patient has consented to  Procedure(s): CIRCUMCISION ADULT (N/A) as a surgical intervention .  The patient's history has been reviewed, patient examined, no change in status, stable for surgery.  I have reviewed the patient's chart and labs.  Questions were answered to the patient's satisfaction.     Louis Meckel W

## 2014-07-03 NOTE — Discharge Instructions (Signed)
Postoperative instructions for circumcision  Wound:  In most cases your incision will have absorbable sutures that run along the course of your incision and will dissolve within the first 10-20 days. Some will fall out even earlier. Expect some redness as the sutures dissolved but this should occur only around the sutures. If there is generalized redness, especially with increasing pain or swelling, let us know. The penis will very likely get "black and blue" as the blood in the tissues spread. Sometimes the whole penis will turn colors. The black and blue is followed by a yellow and [place] color. In time, all the discoloration will go away.  Diet:  You may return to your normal diet within 24 hours following your surgery. You may note some mild nausea and possibly vomiting the first 6-8 hours following surgery. This is usually due to the side effects of anesthesia, and will disappear quite soon. I would suggest clear liquids and a very light meal the first evening following your surgery.  Activity:  Your physical activity should be restricted the first 48 hours. During that time you should remain relatively inactive, moving about only when necessary. During the first 7-10 days following surgery he should avoid lifting any heavy objects (anything greater than 15 pounds), and avoid strenuous exercise. If you work, ask [place] specifically about your restrictions, both for work and home. We will write a note to your employer if needed.  Ice packs can be placed on and off over the penis for the first 48 hours to help relieve the pain and keep the swelling down. Frozen peas or corn in a ZipLock bag can be frozen, used and re-frozen. Fifteen minutes on and 15 minutes off is a reasonable schedule.   Hygiene:  You may shower 48 hours after your surgery. Tub bathing should be restricted until the seventh day.  Medication:  You will be sent home with some type of pain medication. In many cases you will be  sent home with a Tylenol #3. If the pain is not too bad, you may take either Tylenol (acetaminophen) or Advil (ibuprofen) which contain no narcotic agents, and might be tolerated a little better, with fewer side effects. If the pain medication you are sent home with does not control the pain, you will have to let us know. Some narcotic pain medications cannot be given or refilled by a phone call to a pharmacy.  Problems you should report to [place]:   Fever of 101.0 degrees Fahrenheit or greater.  Moderate or severe swelling under the skin incision or involving the scrotum.  Drug reaction such as hives, a rash, nausea or vomiting.

## 2014-07-07 ENCOUNTER — Encounter (HOSPITAL_BASED_OUTPATIENT_CLINIC_OR_DEPARTMENT_OTHER): Payer: Self-pay | Admitting: Urology

## 2014-08-31 ENCOUNTER — Other Ambulatory Visit: Payer: Self-pay | Admitting: Gastroenterology

## 2014-12-14 ENCOUNTER — Other Ambulatory Visit: Payer: Self-pay

## 2015-03-10 ENCOUNTER — Ambulatory Visit (INDEPENDENT_AMBULATORY_CARE_PROVIDER_SITE_OTHER): Payer: Medicare Other | Admitting: Gastroenterology

## 2015-03-10 ENCOUNTER — Encounter: Payer: Self-pay | Admitting: Gastroenterology

## 2015-03-10 VITALS — BP 120/90 | HR 76 | Ht 68.0 in | Wt 203.4 lb

## 2015-03-10 DIAGNOSIS — R1314 Dysphagia, pharyngoesophageal phase: Secondary | ICD-10-CM

## 2015-03-10 DIAGNOSIS — K2100 Gastro-esophageal reflux disease with esophagitis, without bleeding: Secondary | ICD-10-CM

## 2015-03-10 NOTE — Assessment & Plan Note (Signed)
Resolved following esophageal dilatation. 

## 2015-03-10 NOTE — Progress Notes (Signed)
      History of Present Illness:  Benjamin Clayton has returned for follow-up of possible Barrett's esophagus.  At endoscopy in 2014 endoscopic appearance was very suggestive of Barrett's.  Biopsies, however, did not demonstrate an intestinal metaplasia.  I recommended follow-up within 3 years because of the possibility of sampling error.  He feels well and has only occasional pyrosis.  He is maintained on omeprazole.    Review of Systems: Pertinent positive and negative review of systems were noted in the above HPI section. All other review of systems were otherwise negative.    Current Medications, Allergies, Past Medical History, Past Surgical History, Family History and Social History were reviewed in [facility] record  Vital signs were reviewed in today's medical record. Physical Exam: General: Well developed , well nourished, no acute distress Skin: anicteric Head: Normocephalic and atraumatic Eyes:  sclerae anicteric, EOMI Ears: Normal auditory acuity Mouth: No deformity or lesions Lymph Nodes: no lymphadenopathy Lungs: Clear throughout to auscultation Heart: Regular rate and rhythm; no murmurs, rubs or brui: Gastroinestinal:  Soft, non tender and non distended. No masses, hepatosplenomegaly or hernias noted. Normal Bowel sounds Rectal:deferred Musculoskeletal: Symmetrical with no gross deformities  Pulses:  Normal pulses noted Extremities: No clubbing, cyanosis, edema or deformities noted Neurological: Alert oriented x 4, grossly nonfocal Psychological:  Alert and cooperative. Normal mood and affect  See Assessment and Plan under Problem List

## 2015-03-10 NOTE — Assessment & Plan Note (Signed)
Endoscopy demonstrated changes highly suspicious for Barrett's esophagus.  This was not verified by pathology.  Question of sampling error was raised.  Recommendations #1 repeat EGD #2 continue omeprazole

## 2015-03-10 NOTE — Patient Instructions (Signed)

## 2015-04-27 ENCOUNTER — Telehealth: Payer: Self-pay

## 2015-04-27 MED ORDER — OMEPRAZOLE 20 MG PO CPDR: 20.0000 mg | DELAYED_RELEASE_CAPSULE | Freq: Every day | ORAL | Status: DC

## 2015-04-27 NOTE — Telephone Encounter (Signed)
Incoming [facility] from [facility] for Omeprazole 20mg  one a day #90. 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 Please advise.

## 2015-04-27 NOTE — Telephone Encounter (Signed)
Omeprazole refilled for one year.

## 2015-04-27 NOTE — Telephone Encounter (Signed)
Okay to refill if a chronic medication that he needs for symptom control

## 2015-05-05 ENCOUNTER — Encounter: Payer: Self-pay | Admitting: Gastroenterology

## 2015-05-05 ENCOUNTER — Ambulatory Visit (AMBULATORY_SURGERY_CENTER): Payer: Medicare Other | Admitting: Gastroenterology

## 2015-05-05 VITALS — BP 120/84 | HR 58 | Temp 96.8°F | Resp 17 | Ht 68.0 in | Wt 203.0 lb

## 2015-05-05 DIAGNOSIS — K219 Gastro-esophageal reflux disease without esophagitis: Secondary | ICD-10-CM

## 2015-05-05 DIAGNOSIS — K2970 Gastritis, unspecified, without bleeding: Secondary | ICD-10-CM

## 2015-05-05 DIAGNOSIS — K2990 Gastroduodenitis, unspecified, without bleeding: Secondary | ICD-10-CM

## 2015-05-05 DIAGNOSIS — K2270 Barrett's esophagus without dysplasia: Secondary | ICD-10-CM

## 2015-05-05 MED ORDER — SODIUM CHLORIDE 0.9 % IV SOLN: 500.0000 mL | INTRAVENOUS | Status: DC

## 2015-05-05 NOTE — Patient Instructions (Signed)
YOU HAD AN ENDOSCOPIC PROCEDURE TODAY AT [facility]:   Refer to the procedure report that was given to you for any specific questions about what was found during the examination.  If the procedure report does not answer your questions, please call your gastroenterologist to clarify.  If you requested that your care partner not be given the details of your procedure findings, then the procedure report has been included in a sealed envelope for you to review at your convenience later.  YOU SHOULD EXPECT: Some feelings of bloating in the abdomen. Passage of more gas than usual.  Walking can help get rid of the air that was put into your GI tract during the procedure and reduce the bloating. If you had a lower endoscopy (such as a colonoscopy or flexible sigmoidoscopy) you may notice spotting of blood in your stool or on the toilet paper. If you underwent a bowel prep for your procedure, you may not have a normal bowel movement for a few days.  Please Note:  You might notice some irritation and congestion in your nose or some drainage.  This is from the oxygen used during your procedure.  There is no need for concern and it should clear up in a day or so.  SYMPTOMS TO REPORT IMMEDIATELY:   Following upper endoscopy (EGD)  Vomiting of blood or coffee ground material  New chest pain or pain under the shoulder blades  Painful or persistently difficult swallowing  New shortness of breath  Fever of 100F or higher  Black, tarry-looking stools  For urgent or emergent issues, a gastroenterologist can be reached at any hour by calling [phone].   DIET: Your first meal following the procedure should be a small meal and then it is ok to progress to your normal diet. Heavy or fried foods are harder to digest and may make you feel nauseous or bloated.  Likewise, meals heavy in dairy and vegetables can increase bloating.  Drink plenty of fluids but you should avoid alcoholic beverages for  24 hours.  ACTIVITY:  You should plan to take it easy for the rest of today and you should NOT DRIVE or use heavy machinery until tomorrow (because of the sedation medicines used during the test).    FOLLOW UP: Our staff will call the number listed on your records the next business day following your procedure to check on you and address any questions or concerns that you may have regarding the information given to you following your procedure. If we do not reach you, we will leave a message.  However, if you are feeling well and you are not experiencing any problems, there is no need to return our call.  We will assume that you have returned to your regular daily activities without incident.  If any biopsies were taken you will be contacted by phone or by letter within the next 1-3 weeks.  Please call us at [phone] if you have not heard about the biopsies in 3 weeks.    SIGNATURES/CONFIDENTIALITY: You and/or your care partner have signed paperwork which will be entered into your electronic medical record.  These signatures attest to the fact that that the information above on your After Visit Summary has been reviewed and is understood.  Full responsibility of the confidentiality of this discharge information lies with you and/or your care-partner.  Avoid NSAIDs  Gastritis-handout given  Wait biopsy results.

## 2015-05-05 NOTE — Progress Notes (Signed)
Called to room to assist during endoscopic procedure.  Patient ID and intended procedure confirmed with present staff. Received instructions for my participation in the procedure from the performing physician.  

## 2015-05-05 NOTE — Op Note (Signed)
[facility]  [address] [place], [address]   ENDOSCOPY PROCEDURE REPORT  PATIENT: Benjamin, Clayton  MR#: [id number] BIRTHDATE: [birth year] , 75  yrs. old GENDER: male ENDOSCOPIST: Yetta Flock, MD REFERRED BY: PROCEDURE DATE:  05/05/2015 PROCEDURE:  EGD w/ biopsy ASA CLASS:     Class II INDICATIONS:  heartburn and follow up for possible Barrett's. MEDICATIONS: Propofol 200 mg IV TOPICAL ANESTHETIC:  DESCRIPTION OF PROCEDURE: After the risks benefits and alternatives of the procedure were thoroughly explained, informed consent was obtained.  The [id number] endoscope was introduced through the mouth and advanced to the second portion of the duodenum , Without limitations.  The instrument was slowly withdrawn as the mucosa was fully examined.   FINDINGS:The esophagus was normal.  The SCJ was slightly irregular with a < 1cm segment of extension of salmon colored mucosa circumferentially, without nodularity.  Biopsies were obtained.  DH noted at 37cm from the incisors with GEJ and SCJ located 35cm from the incisors, with a 2cm hiatal hernia.  The stomach was remarkable for antral gastritis, without focal ulceration or erosions.  The remainder of the examined stomach was normal.  Biopsies taken to rule out H pylori.  The duodenal bulb and 2nd portion of the duodenum was normal.  Retroflexed views revealed no abnormalities. The scope was then withdrawn from the patient and the procedure completed.  COMPLICATIONS: There were no immediate complications.  ENDOSCOPIC IMPRESSION: Normal esophagus Irregular SCJ - likely insignificant, biopsies obtained. Antral gastritis without ulceration / erosion - biopsies taken to rule out H pylori Normal duodenum  RECOMMENDATIONS: Resume medications Resume diet Avoid NSAIDs Await pathology results   eSigned:  Yetta Flock, MD 05/05/2015 11:58 AM    CC: the patient  PATIENT NAME:  Benjamin Clayton, Benjamin Clayton MR#: [id number]

## 2015-05-05 NOTE — Progress Notes (Signed)
Report to PACU, RN, vss, BBS= Clear.  

## 2015-05-06 ENCOUNTER — Telehealth: Payer: Self-pay | Admitting: *Deleted

## 2015-05-06 NOTE — Telephone Encounter (Signed)
  Follow up Call-  Call back number 05/05/2015 01/28/2013  Post procedure Call Back phone  # [phone] cell [phone]  Permission to leave phone message Yes Yes     Patient questions:  Do you have a fever, pain , or abdominal swelling? No. Pain Score  0 *  Have you tolerated food without any problems? Yes.    Have you been able to return to your normal activities? Yes.    Do you have any questions about your discharge instructions: Diet   No. Medications  No. Follow up visit  No.  Do you have questions or concerns about your Care? No.  Actions: * If pain score is 4 or above: No action needed, pain <4.

## 2015-05-11 ENCOUNTER — Encounter: Payer: Medicare Other | Admitting: Gastroenterology

## 2015-05-11 ENCOUNTER — Encounter: Payer: Self-pay | Admitting: Gastroenterology

## 2015-05-11 ENCOUNTER — Other Ambulatory Visit: Payer: Self-pay | Admitting: Gastroenterology

## 2015-05-25 ENCOUNTER — Telehealth: Payer: Self-pay | Admitting: Gastroenterology

## 2015-05-25 MED ORDER — OMEPRAZOLE 20 MG PO CPDR: 20.0000 mg | DELAYED_RELEASE_CAPSULE | Freq: Every day | ORAL | Status: AC

## 2015-05-27 ENCOUNTER — Encounter: Payer: Medicare Other | Admitting: Gastroenterology

## 2015-10-16 ENCOUNTER — Other Ambulatory Visit: Payer: Self-pay | Admitting: Gastroenterology

## 2016-10-17 DEATH — deceased

## 2016-11-09 ENCOUNTER — Other Ambulatory Visit: Payer: Self-pay | Admitting: Gastroenterology

## 2017-02-05 ENCOUNTER — Telehealth: Payer: Self-pay | Admitting: Gastroenterology

## 2017-02-05 NOTE — Telephone Encounter (Signed)
OK 

## 2017-02-05 NOTE — Telephone Encounter (Signed)
Dr. Havery Moros,  Is it ok for patient to transfer to Dr. Fuller Plan? Patient states that his wife is one of Dr. Lynne Leader patients and this is why he is wanting to transfer. Thank You!

## 2017-02-05 NOTE — Telephone Encounter (Signed)
OK with me however pt has also seen Dr. Havery Moros so please check with him.

## 2017-02-05 NOTE — Telephone Encounter (Signed)
Yes that's fine. He can see Dr. Fuller Plan if that is his preference, I've seen him for EGD only in 2016.

## 2017-02-06 NOTE — Telephone Encounter (Signed)
Informed patient of this and he states that he will callback to schedule.

## 2017-02-15 NOTE — Telephone Encounter (Signed)
Ladene Artist, MD  Marzella Schlein, CMA        He has a history of tubular adenomas in 2001 and 2008 so a 5 year interval for his colonoscopy is appropriate, so 06/2017.   Previous Messages

## 2017-02-15 NOTE — Telephone Encounter (Signed)
Patient informed of his recall put in Epic for 06/2017.

## 2017-07-24 ENCOUNTER — Encounter: Payer: Self-pay | Admitting: Gastroenterology

## 2017-07-25 ENCOUNTER — Telehealth: Payer: Self-pay | Admitting: Gastroenterology

## 2017-07-25 NOTE — Telephone Encounter (Signed)
Patient thinks that he is due for endo as well as colon with Dr.Stark. Patient wanting to know if he can have both procedure sch together. Please advise

## 2017-07-25 NOTE — Telephone Encounter (Signed)
Upon reviewing the chart the patient does not have a history of Barrett's that he believed he had.  See pathology from 2016 and 2014 as well as letter dated 05/11/15.  He is not having any complaints including heartburn reflux, or dysphagia. Colonoscopy scheduled for 08/10/17 and pre-visit on 07/31/17

## 2017-07-31 ENCOUNTER — Other Ambulatory Visit: Payer: Self-pay

## 2017-07-31 ENCOUNTER — Ambulatory Visit (AMBULATORY_SURGERY_CENTER): Payer: Self-pay | Admitting: *Deleted

## 2017-07-31 VITALS — Ht 68.0 in | Wt 213.0 lb

## 2017-07-31 DIAGNOSIS — Z860100 Personal history of colon polyps, unspecified: Secondary | ICD-10-CM

## 2017-07-31 MED ORDER — NA SULFATE-K SULFATE-MG SULF 17.5-3.13-1.6 GM/177ML PO SOLN: 1.0000 | Freq: Once | ORAL | 0 refills | Status: AC

## 2017-07-31 NOTE — Progress Notes (Signed)
No egg or soy allergy known to patient  No issues with past sedation with any surgeries  or procedures, no intubation problems  No diet pills per patient No home 02 use per patient  No blood thinners per patient  Pt denies issues with constipation  No A fib or A flutter  EMMI video sent to pt's e mail pt declined   

## 2017-08-10 ENCOUNTER — Encounter: Payer: Self-pay | Admitting: Gastroenterology

## 2017-08-10 ENCOUNTER — Other Ambulatory Visit: Payer: Self-pay

## 2017-08-10 ENCOUNTER — Ambulatory Visit (AMBULATORY_SURGERY_CENTER): Payer: Medicare Other | Admitting: Gastroenterology

## 2017-08-10 VITALS — BP 118/58 | HR 59 | Temp 97.8°F | Resp 16 | Ht 68.0 in | Wt 213.0 lb

## 2017-08-10 DIAGNOSIS — Z86010 Personal history of colonic polyps: Secondary | ICD-10-CM | POA: Diagnosis not present

## 2017-08-10 DIAGNOSIS — D124 Benign neoplasm of descending colon: Secondary | ICD-10-CM | POA: Diagnosis not present

## 2017-08-10 DIAGNOSIS — K635 Polyp of colon: Secondary | ICD-10-CM

## 2017-08-10 DIAGNOSIS — D123 Benign neoplasm of transverse colon: Secondary | ICD-10-CM

## 2017-08-10 DIAGNOSIS — D125 Benign neoplasm of sigmoid colon: Secondary | ICD-10-CM

## 2017-08-10 MED ORDER — SODIUM CHLORIDE 0.9 % IV SOLN: 500.0000 mL | Freq: Once | INTRAVENOUS | Status: AC

## 2017-08-10 NOTE — Progress Notes (Signed)
Pt's states no medical or surgical changes since previsit or office visit. 

## 2017-08-10 NOTE — Op Note (Signed)
[facility] Patient Name: Benjamin Clayton Procedure Date: 08/10/2017 3:19 PM MRN: [id number] Endoscopist: Ladene Artist , MD Age: 72 Referring MD:  Date of Birth: 1945/10/13 Gender: Male Account #: [id number] Procedure:                Colonoscopy Indications:              Surveillance: Personal history of adenomatous                            polyps on last colonoscopy 5 years ago Medicines:                Monitored Anesthesia Care Procedure:                Pre-Anesthesia Assessment:                           - Prior to the procedure, a History and Physical                            was performed, and patient medications and                            allergies were reviewed. The patient's tolerance of                            previous anesthesia was also reviewed. The risks                            and benefits of the procedure and the sedation                            options and risks were discussed with the patient.                            All questions were answered, and informed consent                            was obtained. Prior Anticoagulants: The patient has                            taken no previous anticoagulant or antiplatelet                            agents. ASA Grade Assessment: II - A patient with                            mild systemic disease. After reviewing the risks                            and benefits, the patient was deemed in                            satisfactory condition to undergo the procedure.  After obtaining informed consent, the colonoscope                            was passed under direct vision. Throughout the                            procedure, the patient's blood pressure, pulse, and                            oxygen saturations were monitored continuously. The                            Model [id number]) scope was introduced                            through the anus and  advanced to the the cecum,                            identified by appendiceal orifice and ileocecal                            valve. The ileocecal valve, appendiceal orifice,                            and rectum were photographed. The quality of the                            bowel preparation was excellent. The colonoscopy                            was performed without difficulty. The patient                            tolerated the procedure well. Scope In: 3:29:21 PM Scope Out: 3:47:57 PM Scope Withdrawal Time: 0 hours 15 minutes 46 seconds  Total Procedure Duration: 0 hours 18 minutes 36 seconds  Findings:                 The perianal and digital rectal examinations were                            normal.                           Two sessile polyps were found in the sigmoid colon.                            The polyps were 6 mm in size. These polyps were                            removed with a cold snare. Resection and retrieval                            were complete.  Two sessile polyps were found in the descending                            colon and transverse colon. The polyps were 5 mm in                            size. These polyps were removed with a cold biopsy                            forceps. Resection and retrieval were complete.                           Multiple medium-mouthed diverticula were found in                            the left colon. There was evidence of diverticular                            spasm. Peri-diverticular erythema was seen. There                            was no evidence of diverticular bleeding.                           Internal hemorrhoids were found during                            retroflexion. The hemorrhoids were small and Grade                            I (internal hemorrhoids that do not prolapse).                           The exam was otherwise without abnormality on                             direct and retroflexion views. Complications:            No immediate complications. Estimated blood loss:                            None. Estimated Blood Loss:     Estimated blood loss: none. Impression:               - Two 6 mm polyps in the sigmoid colon, removed                            with a cold snare. Resected and retrieved.                           - Two 5 mm polyps in the descending colon and in                            the transverse colon, removed with a cold biopsy  forceps. Resected and retrieved.                           - Moderate diverticulosis in the left colon. There                            was evidence of diverticular spasm.                            Peri-diverticular erythema was seen. There was no                            evidence of diverticular bleeding.                           - Internal hemorrhoids.                           - The examination was otherwise normal on direct                            and retroflexion views. Recommendation:           - Repeat colonoscopy in 5 years for surveillance.                           - Patient has a contact number available for                            emergencies. The signs and symptoms of potential                            delayed complications were discussed with the                            patient. Return to normal activities tomorrow.                            Written discharge instructions were provided to the                            patient.                           - High fiber diet.                           - Continue present medications.                           - Await pathology results. Ladene Artist, MD 08/10/2017 3:52:50 PM This report has been signed electronically.

## 2017-08-10 NOTE — Progress Notes (Signed)
Called to room to assist during endoscopic procedure.  Patient ID and intended procedure confirmed with present staff. Received instructions for my participation in the procedure from the performing physician.  

## 2017-08-10 NOTE — Progress Notes (Signed)
A and O x3. Report to RN. Tolerated MAC anesthesia well.

## 2017-08-10 NOTE — Patient Instructions (Signed)
YOU HAD AN ENDOSCOPIC PROCEDURE TODAY AT [facility]:   Refer to the procedure report that was given to you for any specific questions about what was found during the examination.  If the procedure report does not answer your questions, please call your gastroenterologist to clarify.  If you requested that your care partner not be given the details of your procedure findings, then the procedure report has been included in a sealed envelope for you to review at your convenience later.  YOU SHOULD EXPECT: Some feelings of bloating in the abdomen. Passage of more gas than usual.  Walking can help get rid of the air that was put into your GI tract during the procedure and reduce the bloating. If you had a lower endoscopy (such as a colonoscopy or flexible sigmoidoscopy) you may notice spotting of blood in your stool or on the toilet paper. If you underwent a bowel prep for your procedure, you may not have a normal bowel movement for a few days.  Please Note:  You might notice some irritation and congestion in your nose or some drainage.  This is from the oxygen used during your procedure.  There is no need for concern and it should clear up in a day or so.  SYMPTOMS TO REPORT IMMEDIATELY:   Following lower endoscopy (colonoscopy or flexible sigmoidoscopy):  Excessive amounts of blood in the stool  Significant tenderness or worsening of abdominal pains  Swelling of the abdomen that is new, acute  Fever of 100F or higher  For urgent or emergent issues, a gastroenterologist can be reached at any hour by calling [phone].   DIET:  We do recommend a small meal at first, but then you may proceed to your regular diet.  Drink plenty of fluids but you should avoid alcoholic beverages for 24 hours. Try to increase the fiber in your diet, and drink plenty of water.  ACTIVITY:  You should plan to take it easy for the rest of today and you should NOT DRIVE or use heavy machinery until  tomorrow (because of the sedation medicines used during the test).    FOLLOW UP: Our staff will call the number listed on your records the next business day following your procedure to check on you and address any questions or concerns that you may have regarding the information given to you following your procedure. If we do not reach you, we will leave a message.  However, if you are feeling well and you are not experiencing any problems, there is no need to return our call.  We will assume that you have returned to your regular daily activities without incident.  If any biopsies were taken you will be contacted by phone or by letter within the next 1-3 weeks.  Please call us at [phone] if you have not heard about the biopsies in 3 weeks.    SIGNATURES/CONFIDENTIALITY: You and/or your care partner have signed paperwork which will be entered into your electronic medical record.  These signatures attest to the fact that that the information above on your After Visit Summary has been reviewed and is understood.  Full responsibility of the confidentiality of this discharge information lies with you and/or your care-partner.  Read all handouts given to you by your recovery room nurse.

## 2017-08-13 ENCOUNTER — Telehealth: Payer: Self-pay

## 2017-08-13 NOTE — Telephone Encounter (Signed)
  Follow up Call-  Call back number 08/10/2017 05/05/2015  Post procedure Call Back phone  # [phone]  Permission to leave phone message Yes Yes  Some recent data might be hidden     Patient questions:  Do you have a fever, pain , or abdominal swelling? No. Pain Score  0 *  Have you tolerated food without any problems? Yes.    Have you been able to return to your normal activities? Yes.    Do you have any questions about your discharge instructions: Diet   No. Medications  No. Follow up visit  No.  Do you have questions or concerns about your Care? No.  Actions: * If pain score is 4 or above: No action needed, pain <4.  No problems noted per pt. maw

## 2017-08-18 ENCOUNTER — Encounter: Payer: Self-pay | Admitting: Gastroenterology

## 2018-07-02 DIAGNOSIS — R351 Nocturia: Secondary | ICD-10-CM | POA: Diagnosis not present

## 2018-07-02 DIAGNOSIS — N401 Enlarged prostate with lower urinary tract symptoms: Secondary | ICD-10-CM | POA: Diagnosis not present

## 2018-07-02 DIAGNOSIS — R350 Frequency of micturition: Secondary | ICD-10-CM | POA: Diagnosis not present

## 2018-07-22 DIAGNOSIS — N401 Enlarged prostate with lower urinary tract symptoms: Secondary | ICD-10-CM | POA: Diagnosis not present

## 2018-07-22 DIAGNOSIS — R350 Frequency of micturition: Secondary | ICD-10-CM | POA: Diagnosis not present

## 2018-07-22 DIAGNOSIS — R351 Nocturia: Secondary | ICD-10-CM | POA: Diagnosis not present

## 2018-10-16 ENCOUNTER — Other Ambulatory Visit: Payer: Self-pay

## 2018-10-18 ENCOUNTER — Other Ambulatory Visit: Payer: Self-pay | Admitting: *Deleted

## 2018-10-18 DIAGNOSIS — Z6830 Body mass index (BMI) 30.0-30.9, adult: Secondary | ICD-10-CM | POA: Diagnosis not present

## 2018-10-18 DIAGNOSIS — E119 Type 2 diabetes mellitus without complications: Secondary | ICD-10-CM | POA: Diagnosis not present

## 2018-10-18 DIAGNOSIS — I10 Essential (primary) hypertension: Secondary | ICD-10-CM | POA: Diagnosis not present

## 2018-10-18 DIAGNOSIS — E669 Obesity, unspecified: Secondary | ICD-10-CM | POA: Diagnosis not present

## 2018-11-06 ENCOUNTER — Other Ambulatory Visit: Payer: Self-pay | Admitting: *Deleted

## 2018-11-06 NOTE — Patient Outreach (Signed)
[facility]) Care Management  11/06/2018  Benjamin Clayton Jul 29, 1945 [id number]   RN Health Coach attempted #1 follow up outreach call to patient.  Patient was unavailable. HIPPA compliance voicemail message left with return callback number.  Plan: RN will call patient again within 30 days.  [facility] [phone] .

## 2018-12-05 ENCOUNTER — Other Ambulatory Visit: Payer: Self-pay | Admitting: *Deleted

## 2018-12-05 NOTE — Patient Outreach (Signed)
[facility]) Care Management  12/05/2018  Benjamin Clayton 1945/08/24 [id number]   RN Health Coach is closing this program. Consumer is enrolled in [facility] [place] [facility] external program.  [facility] [phone]

## 2018-12-06 ENCOUNTER — Ambulatory Visit: Payer: Self-pay | Admitting: *Deleted

## 2018-12-10 DIAGNOSIS — I10 Essential (primary) hypertension: Secondary | ICD-10-CM | POA: Diagnosis not present

## 2018-12-10 DIAGNOSIS — Z79899 Other long term (current) drug therapy: Secondary | ICD-10-CM | POA: Diagnosis not present

## 2018-12-10 DIAGNOSIS — E119 Type 2 diabetes mellitus without complications: Secondary | ICD-10-CM | POA: Diagnosis not present

## 2018-12-10 DIAGNOSIS — E538 Deficiency of other specified B group vitamins: Secondary | ICD-10-CM | POA: Diagnosis not present

## 2018-12-10 DIAGNOSIS — Z6830 Body mass index (BMI) 30.0-30.9, adult: Secondary | ICD-10-CM | POA: Diagnosis not present

## 2018-12-10 DIAGNOSIS — K210 Gastro-esophageal reflux disease with esophagitis: Secondary | ICD-10-CM | POA: Diagnosis not present

## 2018-12-10 DIAGNOSIS — E6609 Other obesity due to excess calories: Secondary | ICD-10-CM | POA: Diagnosis not present

## 2019-01-08 DIAGNOSIS — K862 Cyst of pancreas: Secondary | ICD-10-CM | POA: Diagnosis not present

## 2019-01-08 DIAGNOSIS — K863 Pseudocyst of pancreas: Secondary | ICD-10-CM | POA: Diagnosis not present

## 2019-01-08 DIAGNOSIS — E119 Type 2 diabetes mellitus without complications: Secondary | ICD-10-CM | POA: Diagnosis not present

## 2019-01-21 DIAGNOSIS — N401 Enlarged prostate with lower urinary tract symptoms: Secondary | ICD-10-CM | POA: Diagnosis not present

## 2019-01-28 DIAGNOSIS — R351 Nocturia: Secondary | ICD-10-CM | POA: Diagnosis not present

## 2019-01-28 DIAGNOSIS — N401 Enlarged prostate with lower urinary tract symptoms: Secondary | ICD-10-CM | POA: Diagnosis not present

## 2019-01-28 DIAGNOSIS — N5201 Erectile dysfunction due to arterial insufficiency: Secondary | ICD-10-CM | POA: Diagnosis not present

## 2019-04-08 DIAGNOSIS — M8949 Other hypertrophic osteoarthropathy, multiple sites: Secondary | ICD-10-CM | POA: Diagnosis not present

## 2019-04-08 DIAGNOSIS — I10 Essential (primary) hypertension: Secondary | ICD-10-CM | POA: Diagnosis not present

## 2019-04-08 DIAGNOSIS — K2100 Gastro-esophageal reflux disease with esophagitis, without bleeding: Secondary | ICD-10-CM | POA: Diagnosis not present

## 2019-04-08 DIAGNOSIS — Z23 Encounter for immunization: Secondary | ICD-10-CM | POA: Diagnosis not present

## 2019-04-08 DIAGNOSIS — E6609 Other obesity due to excess calories: Secondary | ICD-10-CM | POA: Diagnosis not present

## 2019-04-08 DIAGNOSIS — Z79899 Other long term (current) drug therapy: Secondary | ICD-10-CM | POA: Diagnosis not present

## 2019-04-08 DIAGNOSIS — K2270 Barrett's esophagus without dysplasia: Secondary | ICD-10-CM | POA: Diagnosis not present

## 2019-04-08 DIAGNOSIS — E782 Mixed hyperlipidemia: Secondary | ICD-10-CM | POA: Diagnosis not present

## 2019-04-08 DIAGNOSIS — Z794 Long term (current) use of insulin: Secondary | ICD-10-CM | POA: Diagnosis not present

## 2019-04-08 DIAGNOSIS — Z6831 Body mass index (BMI) 31.0-31.9, adult: Secondary | ICD-10-CM | POA: Diagnosis not present

## 2019-04-08 DIAGNOSIS — E119 Type 2 diabetes mellitus without complications: Secondary | ICD-10-CM | POA: Diagnosis not present

## 2019-04-17 DIAGNOSIS — L578 Other skin changes due to chronic exposure to nonionizing radiation: Secondary | ICD-10-CM | POA: Diagnosis not present

## 2019-04-17 DIAGNOSIS — L570 Actinic keratosis: Secondary | ICD-10-CM | POA: Diagnosis not present

## 2019-04-17 DIAGNOSIS — L821 Other seborrheic keratosis: Secondary | ICD-10-CM | POA: Diagnosis not present

## 2019-06-18 DIAGNOSIS — J019 Acute sinusitis, unspecified: Secondary | ICD-10-CM | POA: Diagnosis not present

## 2019-06-18 DIAGNOSIS — E119 Type 2 diabetes mellitus without complications: Secondary | ICD-10-CM | POA: Diagnosis not present

## 2019-06-18 DIAGNOSIS — Z794 Long term (current) use of insulin: Secondary | ICD-10-CM | POA: Diagnosis not present

## 2019-08-06 DIAGNOSIS — E119 Type 2 diabetes mellitus without complications: Secondary | ICD-10-CM | POA: Diagnosis not present

## 2019-08-06 DIAGNOSIS — N529 Male erectile dysfunction, unspecified: Secondary | ICD-10-CM | POA: Diagnosis not present

## 2019-08-06 DIAGNOSIS — Z6832 Body mass index (BMI) 32.0-32.9, adult: Secondary | ICD-10-CM | POA: Diagnosis not present

## 2019-08-06 DIAGNOSIS — E6609 Other obesity due to excess calories: Secondary | ICD-10-CM | POA: Diagnosis not present

## 2019-08-06 DIAGNOSIS — Z794 Long term (current) use of insulin: Secondary | ICD-10-CM | POA: Diagnosis not present

## 2019-08-06 DIAGNOSIS — D649 Anemia, unspecified: Secondary | ICD-10-CM | POA: Diagnosis not present

## 2019-08-06 DIAGNOSIS — I10 Essential (primary) hypertension: Secondary | ICD-10-CM | POA: Diagnosis not present

## 2019-08-06 DIAGNOSIS — E538 Deficiency of other specified B group vitamins: Secondary | ICD-10-CM | POA: Diagnosis not present

## 2019-08-06 DIAGNOSIS — Z0000 Encounter for general adult medical examination without abnormal findings: Secondary | ICD-10-CM | POA: Diagnosis not present

## 2019-09-19 DIAGNOSIS — Z794 Long term (current) use of insulin: Secondary | ICD-10-CM | POA: Diagnosis not present

## 2019-09-19 DIAGNOSIS — E782 Mixed hyperlipidemia: Secondary | ICD-10-CM | POA: Diagnosis not present

## 2019-09-19 DIAGNOSIS — E119 Type 2 diabetes mellitus without complications: Secondary | ICD-10-CM | POA: Diagnosis not present

## 2019-09-19 DIAGNOSIS — I10 Essential (primary) hypertension: Secondary | ICD-10-CM | POA: Diagnosis not present

## 2019-09-19 DIAGNOSIS — K863 Pseudocyst of pancreas: Secondary | ICD-10-CM | POA: Diagnosis not present

## 2019-09-19 DIAGNOSIS — K862 Cyst of pancreas: Secondary | ICD-10-CM | POA: Diagnosis not present

## 2019-12-05 DIAGNOSIS — Z6831 Body mass index (BMI) 31.0-31.9, adult: Secondary | ICD-10-CM | POA: Diagnosis not present

## 2019-12-05 DIAGNOSIS — E782 Mixed hyperlipidemia: Secondary | ICD-10-CM | POA: Diagnosis not present

## 2019-12-05 DIAGNOSIS — R609 Edema, unspecified: Secondary | ICD-10-CM | POA: Diagnosis not present

## 2019-12-05 DIAGNOSIS — D696 Thrombocytopenia, unspecified: Secondary | ICD-10-CM | POA: Diagnosis not present

## 2019-12-05 DIAGNOSIS — E119 Type 2 diabetes mellitus without complications: Secondary | ICD-10-CM | POA: Diagnosis not present

## 2019-12-05 DIAGNOSIS — Z794 Long term (current) use of insulin: Secondary | ICD-10-CM | POA: Diagnosis not present

## 2019-12-05 DIAGNOSIS — I10 Essential (primary) hypertension: Secondary | ICD-10-CM | POA: Diagnosis not present

## 2019-12-05 DIAGNOSIS — K7581 Nonalcoholic steatohepatitis (NASH): Secondary | ICD-10-CM | POA: Diagnosis not present

## 2019-12-05 DIAGNOSIS — K2100 Gastro-esophageal reflux disease with esophagitis, without bleeding: Secondary | ICD-10-CM | POA: Diagnosis not present

## 2019-12-05 DIAGNOSIS — E6609 Other obesity due to excess calories: Secondary | ICD-10-CM | POA: Diagnosis not present

## 2019-12-10 DIAGNOSIS — K7581 Nonalcoholic steatohepatitis (NASH): Secondary | ICD-10-CM | POA: Diagnosis not present

## 2019-12-10 DIAGNOSIS — N281 Cyst of kidney, acquired: Secondary | ICD-10-CM | POA: Diagnosis not present

## 2019-12-10 DIAGNOSIS — E1121 Type 2 diabetes mellitus with diabetic nephropathy: Secondary | ICD-10-CM | POA: Diagnosis not present

## 2019-12-10 DIAGNOSIS — N189 Chronic kidney disease, unspecified: Secondary | ICD-10-CM | POA: Diagnosis not present

## 2020-01-08 DIAGNOSIS — R5383 Other fatigue: Secondary | ICD-10-CM | POA: Diagnosis not present

## 2020-01-08 DIAGNOSIS — R809 Proteinuria, unspecified: Secondary | ICD-10-CM | POA: Diagnosis not present

## 2020-01-08 DIAGNOSIS — R748 Abnormal levels of other serum enzymes: Secondary | ICD-10-CM | POA: Diagnosis not present

## 2020-01-08 DIAGNOSIS — Z1159 Encounter for screening for other viral diseases: Secondary | ICD-10-CM | POA: Diagnosis not present

## 2020-01-08 DIAGNOSIS — I10 Essential (primary) hypertension: Secondary | ICD-10-CM | POA: Diagnosis not present

## 2020-01-08 DIAGNOSIS — R5381 Other malaise: Secondary | ICD-10-CM | POA: Diagnosis not present

## 2020-01-08 DIAGNOSIS — Z794 Long term (current) use of insulin: Secondary | ICD-10-CM | POA: Diagnosis not present

## 2020-01-08 DIAGNOSIS — R609 Edema, unspecified: Secondary | ICD-10-CM | POA: Diagnosis not present

## 2020-01-08 DIAGNOSIS — E538 Deficiency of other specified B group vitamins: Secondary | ICD-10-CM | POA: Diagnosis not present

## 2020-01-08 DIAGNOSIS — E1121 Type 2 diabetes mellitus with diabetic nephropathy: Secondary | ICD-10-CM | POA: Diagnosis not present

## 2020-01-08 DIAGNOSIS — I739 Peripheral vascular disease, unspecified: Secondary | ICD-10-CM | POA: Diagnosis not present

## 2020-01-19 DIAGNOSIS — M7989 Other specified soft tissue disorders: Secondary | ICD-10-CM | POA: Diagnosis not present

## 2020-01-29 DIAGNOSIS — E1121 Type 2 diabetes mellitus with diabetic nephropathy: Secondary | ICD-10-CM | POA: Diagnosis not present

## 2020-01-29 DIAGNOSIS — T39395A Adverse effect of other nonsteroidal anti-inflammatory drugs [NSAID], initial encounter: Secondary | ICD-10-CM | POA: Diagnosis not present

## 2020-01-29 DIAGNOSIS — R809 Proteinuria, unspecified: Secondary | ICD-10-CM | POA: Diagnosis not present

## 2020-01-29 DIAGNOSIS — N141 Nephropathy induced by other drugs, medicaments and biological substances: Secondary | ICD-10-CM | POA: Diagnosis not present

## 2020-01-29 DIAGNOSIS — N049 Nephrotic syndrome with unspecified morphologic changes: Secondary | ICD-10-CM | POA: Diagnosis not present

## 2020-01-29 DIAGNOSIS — Z794 Long term (current) use of insulin: Secondary | ICD-10-CM | POA: Diagnosis not present

## 2020-01-29 DIAGNOSIS — N179 Acute kidney failure, unspecified: Secondary | ICD-10-CM | POA: Diagnosis not present

## 2020-01-29 DIAGNOSIS — I10 Essential (primary) hypertension: Secondary | ICD-10-CM | POA: Diagnosis not present

## 2020-02-05 DIAGNOSIS — J069 Acute upper respiratory infection, unspecified: Secondary | ICD-10-CM | POA: Diagnosis not present

## 2020-02-06 DIAGNOSIS — Z20822 Contact with and (suspected) exposure to covid-19: Secondary | ICD-10-CM | POA: Diagnosis not present

## 2020-02-06 DIAGNOSIS — J069 Acute upper respiratory infection, unspecified: Secondary | ICD-10-CM | POA: Diagnosis not present

## 2020-02-09 DIAGNOSIS — I10 Essential (primary) hypertension: Secondary | ICD-10-CM | POA: Diagnosis not present

## 2020-02-09 DIAGNOSIS — D519 Vitamin B12 deficiency anemia, unspecified: Secondary | ICD-10-CM | POA: Diagnosis not present

## 2020-02-09 DIAGNOSIS — K219 Gastro-esophageal reflux disease without esophagitis: Secondary | ICD-10-CM | POA: Diagnosis not present

## 2020-02-09 DIAGNOSIS — N179 Acute kidney failure, unspecified: Secondary | ICD-10-CM | POA: Diagnosis not present

## 2020-02-09 DIAGNOSIS — E669 Obesity, unspecified: Secondary | ICD-10-CM | POA: Diagnosis not present

## 2020-02-09 DIAGNOSIS — E65 Localized adiposity: Secondary | ICD-10-CM | POA: Diagnosis not present

## 2020-02-09 DIAGNOSIS — E119 Type 2 diabetes mellitus without complications: Secondary | ICD-10-CM | POA: Diagnosis not present

## 2020-02-09 DIAGNOSIS — D4989 Neoplasm of unspecified behavior of other specified sites: Secondary | ICD-10-CM | POA: Diagnosis not present

## 2020-02-09 DIAGNOSIS — N049 Nephrotic syndrome with unspecified morphologic changes: Secondary | ICD-10-CM | POA: Diagnosis not present

## 2020-02-09 DIAGNOSIS — E039 Hypothyroidism, unspecified: Secondary | ICD-10-CM | POA: Diagnosis not present

## 2020-02-09 DIAGNOSIS — Z833 Family history of diabetes mellitus: Secondary | ICD-10-CM | POA: Diagnosis not present

## 2020-02-09 DIAGNOSIS — Z538 Procedure and treatment not carried out for other reasons: Secondary | ICD-10-CM | POA: Diagnosis not present

## 2020-02-09 DIAGNOSIS — R9431 Abnormal electrocardiogram [ECG] [EKG]: Secondary | ICD-10-CM | POA: Diagnosis not present

## 2020-02-09 DIAGNOSIS — Z791 Long term (current) use of non-steroidal anti-inflammatories (NSAID): Secondary | ICD-10-CM | POA: Diagnosis not present

## 2020-02-09 DIAGNOSIS — E871 Hypo-osmolality and hyponatremia: Secondary | ICD-10-CM | POA: Diagnosis not present

## 2020-02-09 DIAGNOSIS — N178 Other acute kidney failure: Secondary | ICD-10-CM | POA: Diagnosis not present

## 2020-02-09 DIAGNOSIS — Z888 Allergy status to other drugs, medicaments and biological substances status: Secondary | ICD-10-CM | POA: Diagnosis not present

## 2020-02-09 DIAGNOSIS — Z7982 Long term (current) use of aspirin: Secondary | ICD-10-CM | POA: Diagnosis not present

## 2020-02-09 DIAGNOSIS — Z9049 Acquired absence of other specified parts of digestive tract: Secondary | ICD-10-CM | POA: Diagnosis not present

## 2020-02-09 DIAGNOSIS — Z79899 Other long term (current) drug therapy: Secondary | ICD-10-CM | POA: Diagnosis not present

## 2020-02-09 DIAGNOSIS — D681 Hereditary factor XI deficiency: Secondary | ICD-10-CM | POA: Diagnosis not present

## 2020-02-09 DIAGNOSIS — Z87891 Personal history of nicotine dependence: Secondary | ICD-10-CM | POA: Diagnosis not present

## 2020-02-09 DIAGNOSIS — R791 Abnormal coagulation profile: Secondary | ICD-10-CM | POA: Diagnosis not present

## 2020-02-09 DIAGNOSIS — Z6830 Body mass index (BMI) 30.0-30.9, adult: Secondary | ICD-10-CM | POA: Diagnosis not present

## 2020-02-09 DIAGNOSIS — N400 Enlarged prostate without lower urinary tract symptoms: Secondary | ICD-10-CM | POA: Diagnosis not present

## 2020-02-09 DIAGNOSIS — R809 Proteinuria, unspecified: Secondary | ICD-10-CM | POA: Diagnosis not present

## 2020-02-09 DIAGNOSIS — Z808 Family history of malignant neoplasm of other organs or systems: Secondary | ICD-10-CM | POA: Diagnosis not present

## 2020-02-09 DIAGNOSIS — Z794 Long term (current) use of insulin: Secondary | ICD-10-CM | POA: Diagnosis not present

## 2020-02-09 DIAGNOSIS — Z825 Family history of asthma and other chronic lower respiratory diseases: Secondary | ICD-10-CM | POA: Diagnosis not present

## 2020-02-09 DIAGNOSIS — E859 Amyloidosis, unspecified: Secondary | ICD-10-CM | POA: Diagnosis not present

## 2020-02-09 DIAGNOSIS — D682 Hereditary deficiency of other clotting factors: Secondary | ICD-10-CM | POA: Diagnosis not present

## 2020-02-09 DIAGNOSIS — E1165 Type 2 diabetes mellitus with hyperglycemia: Secondary | ICD-10-CM | POA: Diagnosis not present

## 2020-02-10 DIAGNOSIS — R791 Abnormal coagulation profile: Secondary | ICD-10-CM | POA: Diagnosis not present

## 2020-02-10 DIAGNOSIS — N049 Nephrotic syndrome with unspecified morphologic changes: Secondary | ICD-10-CM | POA: Diagnosis not present

## 2020-02-11 DIAGNOSIS — R791 Abnormal coagulation profile: Secondary | ICD-10-CM | POA: Diagnosis not present

## 2020-02-11 DIAGNOSIS — N049 Nephrotic syndrome with unspecified morphologic changes: Secondary | ICD-10-CM | POA: Diagnosis not present

## 2020-02-16 DIAGNOSIS — C9000 Multiple myeloma not having achieved remission: Secondary | ICD-10-CM | POA: Diagnosis not present

## 2020-02-16 DIAGNOSIS — E8581 Light chain (AL) amyloidosis: Secondary | ICD-10-CM | POA: Diagnosis not present

## 2020-02-16 DIAGNOSIS — Z87891 Personal history of nicotine dependence: Secondary | ICD-10-CM | POA: Diagnosis not present

## 2020-02-17 DIAGNOSIS — E859 Amyloidosis, unspecified: Secondary | ICD-10-CM | POA: Diagnosis not present

## 2020-02-17 DIAGNOSIS — N184 Chronic kidney disease, stage 4 (severe): Secondary | ICD-10-CM | POA: Diagnosis not present

## 2020-02-17 DIAGNOSIS — C9000 Multiple myeloma not having achieved remission: Secondary | ICD-10-CM | POA: Diagnosis not present

## 2020-02-17 DIAGNOSIS — N179 Acute kidney failure, unspecified: Secondary | ICD-10-CM | POA: Diagnosis not present

## 2020-02-17 DIAGNOSIS — N049 Nephrotic syndrome with unspecified morphologic changes: Secondary | ICD-10-CM | POA: Diagnosis not present

## 2020-02-17 DIAGNOSIS — E1121 Type 2 diabetes mellitus with diabetic nephropathy: Secondary | ICD-10-CM | POA: Diagnosis not present

## 2020-02-17 DIAGNOSIS — Z794 Long term (current) use of insulin: Secondary | ICD-10-CM | POA: Diagnosis not present

## 2020-02-19 DIAGNOSIS — S43001A Unspecified subluxation of right shoulder joint, initial encounter: Secondary | ICD-10-CM | POA: Diagnosis not present

## 2020-02-19 DIAGNOSIS — M11211 Other chondrocalcinosis, right shoulder: Secondary | ICD-10-CM | POA: Diagnosis not present

## 2020-02-19 DIAGNOSIS — S43002A Unspecified subluxation of left shoulder joint, initial encounter: Secondary | ICD-10-CM | POA: Diagnosis not present

## 2020-02-19 DIAGNOSIS — M50323 Other cervical disc degeneration at C6-C7 level: Secondary | ICD-10-CM | POA: Diagnosis not present

## 2020-02-19 DIAGNOSIS — M47812 Spondylosis without myelopathy or radiculopathy, cervical region: Secondary | ICD-10-CM | POA: Diagnosis not present

## 2020-02-19 DIAGNOSIS — M47814 Spondylosis without myelopathy or radiculopathy, thoracic region: Secondary | ICD-10-CM | POA: Diagnosis not present

## 2020-02-19 DIAGNOSIS — Z8781 Personal history of (healed) traumatic fracture: Secondary | ICD-10-CM | POA: Diagnosis not present

## 2020-02-19 DIAGNOSIS — M8949 Other hypertrophic osteoarthropathy, multiple sites: Secondary | ICD-10-CM | POA: Diagnosis not present

## 2020-02-19 DIAGNOSIS — M11262 Other chondrocalcinosis, left knee: Secondary | ICD-10-CM | POA: Diagnosis not present

## 2020-02-19 DIAGNOSIS — M47816 Spondylosis without myelopathy or radiculopathy, lumbar region: Secondary | ICD-10-CM | POA: Diagnosis not present

## 2020-02-19 DIAGNOSIS — M4313 Spondylolisthesis, cervicothoracic region: Secondary | ICD-10-CM | POA: Diagnosis not present

## 2020-02-19 DIAGNOSIS — M47817 Spondylosis without myelopathy or radiculopathy, lumbosacral region: Secondary | ICD-10-CM | POA: Diagnosis not present

## 2020-02-19 DIAGNOSIS — E8581 Light chain (AL) amyloidosis: Secondary | ICD-10-CM | POA: Diagnosis not present

## 2020-02-19 DIAGNOSIS — M11261 Other chondrocalcinosis, right knee: Secondary | ICD-10-CM | POA: Diagnosis not present

## 2020-02-19 DIAGNOSIS — M5137 Other intervertebral disc degeneration, lumbosacral region: Secondary | ICD-10-CM | POA: Diagnosis not present

## 2020-02-20 DIAGNOSIS — R809 Proteinuria, unspecified: Secondary | ICD-10-CM | POA: Diagnosis not present

## 2020-02-20 DIAGNOSIS — E859 Amyloidosis, unspecified: Secondary | ICD-10-CM | POA: Diagnosis not present

## 2020-02-20 DIAGNOSIS — N049 Nephrotic syndrome with unspecified morphologic changes: Secondary | ICD-10-CM | POA: Diagnosis not present

## 2020-02-20 DIAGNOSIS — R791 Abnormal coagulation profile: Secondary | ICD-10-CM | POA: Diagnosis not present

## 2020-02-24 DIAGNOSIS — I110 Hypertensive heart disease with heart failure: Secondary | ICD-10-CM | POA: Diagnosis not present

## 2020-02-24 DIAGNOSIS — E8581 Light chain (AL) amyloidosis: Secondary | ICD-10-CM | POA: Diagnosis not present

## 2020-02-24 DIAGNOSIS — R7881 Bacteremia: Secondary | ICD-10-CM | POA: Diagnosis not present

## 2020-02-24 DIAGNOSIS — D696 Thrombocytopenia, unspecified: Secondary | ICD-10-CM | POA: Diagnosis not present

## 2020-02-24 DIAGNOSIS — J150 Pneumonia due to Klebsiella pneumoniae: Secondary | ICD-10-CM | POA: Diagnosis not present

## 2020-02-24 DIAGNOSIS — N185 Chronic kidney disease, stage 5: Secondary | ICD-10-CM | POA: Diagnosis not present

## 2020-02-24 DIAGNOSIS — A4150 Gram-negative sepsis, unspecified: Secondary | ICD-10-CM | POA: Diagnosis not present

## 2020-02-24 DIAGNOSIS — E119 Type 2 diabetes mellitus without complications: Secondary | ICD-10-CM | POA: Diagnosis not present

## 2020-02-24 DIAGNOSIS — E872 Acidosis: Secondary | ICD-10-CM | POA: Diagnosis not present

## 2020-02-24 DIAGNOSIS — A419 Sepsis, unspecified organism: Secondary | ICD-10-CM | POA: Diagnosis not present

## 2020-02-24 DIAGNOSIS — I5030 Unspecified diastolic (congestive) heart failure: Secondary | ICD-10-CM | POA: Diagnosis not present

## 2020-02-24 DIAGNOSIS — Z5111 Encounter for antineoplastic chemotherapy: Secondary | ICD-10-CM | POA: Diagnosis not present

## 2020-02-24 DIAGNOSIS — R6520 Severe sepsis without septic shock: Secondary | ICD-10-CM | POA: Diagnosis not present

## 2020-02-24 DIAGNOSIS — M7989 Other specified soft tissue disorders: Secondary | ICD-10-CM | POA: Diagnosis not present

## 2020-02-24 DIAGNOSIS — Z4659 Encounter for fitting and adjustment of other gastrointestinal appliance and device: Secondary | ICD-10-CM | POA: Diagnosis not present

## 2020-02-24 DIAGNOSIS — K922 Gastrointestinal hemorrhage, unspecified: Secondary | ICD-10-CM | POA: Diagnosis not present

## 2020-02-24 DIAGNOSIS — Z7984 Long term (current) use of oral hypoglycemic drugs: Secondary | ICD-10-CM | POA: Diagnosis not present

## 2020-02-24 DIAGNOSIS — N2889 Other specified disorders of kidney and ureter: Secondary | ICD-10-CM | POA: Diagnosis not present

## 2020-02-24 DIAGNOSIS — D649 Anemia, unspecified: Secondary | ICD-10-CM | POA: Diagnosis not present

## 2020-02-24 DIAGNOSIS — C9000 Multiple myeloma not having achieved remission: Secondary | ICD-10-CM | POA: Diagnosis not present

## 2020-02-24 DIAGNOSIS — Z95828 Presence of other vascular implants and grafts: Secondary | ICD-10-CM | POA: Diagnosis not present

## 2020-02-24 DIAGNOSIS — I482 Chronic atrial fibrillation, unspecified: Secondary | ICD-10-CM | POA: Diagnosis not present

## 2020-02-24 DIAGNOSIS — R7989 Other specified abnormal findings of blood chemistry: Secondary | ICD-10-CM | POA: Diagnosis not present

## 2020-02-24 DIAGNOSIS — I129 Hypertensive chronic kidney disease with stage 1 through stage 4 chronic kidney disease, or unspecified chronic kidney disease: Secondary | ICD-10-CM | POA: Diagnosis not present

## 2020-02-24 DIAGNOSIS — K219 Gastro-esophageal reflux disease without esophagitis: Secondary | ICD-10-CM | POA: Diagnosis not present

## 2020-02-24 DIAGNOSIS — N049 Nephrotic syndrome with unspecified morphologic changes: Secondary | ICD-10-CM | POA: Diagnosis not present

## 2020-02-24 DIAGNOSIS — E8589 Other amyloidosis: Secondary | ICD-10-CM | POA: Diagnosis not present

## 2020-02-24 DIAGNOSIS — I43 Cardiomyopathy in diseases classified elsewhere: Secondary | ICD-10-CM | POA: Diagnosis not present

## 2020-02-24 DIAGNOSIS — R6521 Severe sepsis with septic shock: Secondary | ICD-10-CM | POA: Diagnosis not present

## 2020-02-24 DIAGNOSIS — J9690 Respiratory failure, unspecified, unspecified whether with hypoxia or hypercapnia: Secondary | ICD-10-CM | POA: Diagnosis not present

## 2020-02-24 DIAGNOSIS — G9341 Metabolic encephalopathy: Secondary | ICD-10-CM | POA: Diagnosis not present

## 2020-02-24 DIAGNOSIS — R1084 Generalized abdominal pain: Secondary | ICD-10-CM | POA: Diagnosis not present

## 2020-02-24 DIAGNOSIS — Z8719 Personal history of other diseases of the digestive system: Secondary | ICD-10-CM | POA: Diagnosis not present

## 2020-02-24 DIAGNOSIS — R609 Edema, unspecified: Secondary | ICD-10-CM | POA: Diagnosis not present

## 2020-02-24 DIAGNOSIS — I959 Hypotension, unspecified: Secondary | ICD-10-CM | POA: Diagnosis not present

## 2020-02-24 DIAGNOSIS — K710 Toxic liver disease with cholestasis: Secondary | ICD-10-CM | POA: Diagnosis not present

## 2020-02-24 DIAGNOSIS — A414 Sepsis due to anaerobes: Secondary | ICD-10-CM | POA: Diagnosis not present

## 2020-02-24 DIAGNOSIS — E8809 Other disorders of plasma-protein metabolism, not elsewhere classified: Secondary | ICD-10-CM | POA: Diagnosis not present

## 2020-02-24 DIAGNOSIS — K921 Melena: Secondary | ICD-10-CM | POA: Diagnosis not present

## 2020-02-24 DIAGNOSIS — B961 Klebsiella pneumoniae [K. pneumoniae] as the cause of diseases classified elsewhere: Secondary | ICD-10-CM | POA: Diagnosis not present

## 2020-02-24 DIAGNOSIS — I071 Rheumatic tricuspid insufficiency: Secondary | ICD-10-CM | POA: Diagnosis not present

## 2020-02-24 DIAGNOSIS — E1151 Type 2 diabetes mellitus with diabetic peripheral angiopathy without gangrene: Secondary | ICD-10-CM | POA: Diagnosis not present

## 2020-02-24 DIAGNOSIS — D681 Hereditary factor XI deficiency: Secondary | ICD-10-CM | POA: Diagnosis not present

## 2020-02-24 DIAGNOSIS — R1032 Left lower quadrant pain: Secondary | ICD-10-CM | POA: Diagnosis not present

## 2020-02-24 DIAGNOSIS — E875 Hyperkalemia: Secondary | ICD-10-CM | POA: Diagnosis not present

## 2020-02-24 DIAGNOSIS — R000 Tachycardia, unspecified: Secondary | ICD-10-CM | POA: Diagnosis not present

## 2020-02-24 DIAGNOSIS — J9601 Acute respiratory failure with hypoxia: Secondary | ICD-10-CM | POA: Diagnosis not present

## 2020-02-24 DIAGNOSIS — N184 Chronic kidney disease, stage 4 (severe): Secondary | ICD-10-CM | POA: Diagnosis not present

## 2020-02-24 DIAGNOSIS — E8770 Fluid overload, unspecified: Secondary | ICD-10-CM | POA: Diagnosis not present

## 2020-02-24 DIAGNOSIS — Z794 Long term (current) use of insulin: Secondary | ICD-10-CM | POA: Diagnosis not present

## 2020-02-24 DIAGNOSIS — J810 Acute pulmonary edema: Secondary | ICD-10-CM | POA: Diagnosis not present

## 2020-02-24 DIAGNOSIS — I313 Pericardial effusion (noninflammatory): Secondary | ICD-10-CM | POA: Diagnosis not present

## 2020-02-24 DIAGNOSIS — A4189 Other specified sepsis: Secondary | ICD-10-CM | POA: Diagnosis not present

## 2020-02-24 DIAGNOSIS — E1165 Type 2 diabetes mellitus with hyperglycemia: Secondary | ICD-10-CM | POA: Diagnosis not present

## 2020-02-24 DIAGNOSIS — I10 Essential (primary) hypertension: Secondary | ICD-10-CM | POA: Diagnosis not present

## 2020-02-24 DIAGNOSIS — N189 Chronic kidney disease, unspecified: Secondary | ICD-10-CM | POA: Diagnosis not present

## 2020-02-24 DIAGNOSIS — Z87891 Personal history of nicotine dependence: Secondary | ICD-10-CM | POA: Diagnosis not present

## 2020-02-24 DIAGNOSIS — M8949 Other hypertrophic osteoarthropathy, multiple sites: Secondary | ICD-10-CM | POA: Diagnosis not present

## 2020-02-24 DIAGNOSIS — Z791 Long term (current) use of non-steroidal anti-inflammatories (NSAID): Secondary | ICD-10-CM | POA: Diagnosis not present

## 2020-02-24 DIAGNOSIS — N059 Unspecified nephritic syndrome with unspecified morphologic changes: Secondary | ICD-10-CM | POA: Diagnosis not present

## 2020-02-24 DIAGNOSIS — J90 Pleural effusion, not elsewhere classified: Secondary | ICD-10-CM | POA: Diagnosis not present

## 2020-02-24 DIAGNOSIS — I130 Hypertensive heart and chronic kidney disease with heart failure and stage 1 through stage 4 chronic kidney disease, or unspecified chronic kidney disease: Secondary | ICD-10-CM | POA: Diagnosis not present

## 2020-02-24 DIAGNOSIS — E871 Hypo-osmolality and hyponatremia: Secondary | ICD-10-CM | POA: Diagnosis not present

## 2020-02-24 DIAGNOSIS — R197 Diarrhea, unspecified: Secondary | ICD-10-CM | POA: Diagnosis not present

## 2020-02-24 DIAGNOSIS — R808 Other proteinuria: Secondary | ICD-10-CM | POA: Diagnosis not present

## 2020-02-24 DIAGNOSIS — E039 Hypothyroidism, unspecified: Secondary | ICD-10-CM | POA: Diagnosis not present

## 2020-02-24 DIAGNOSIS — R5383 Other fatigue: Secondary | ICD-10-CM | POA: Diagnosis not present

## 2020-02-24 DIAGNOSIS — K439 Ventral hernia without obstruction or gangrene: Secondary | ICD-10-CM | POA: Diagnosis not present

## 2020-02-24 DIAGNOSIS — R031 Nonspecific low blood-pressure reading: Secondary | ICD-10-CM | POA: Diagnosis not present

## 2020-02-24 DIAGNOSIS — R34 Anuria and oliguria: Secondary | ICD-10-CM | POA: Diagnosis not present

## 2020-02-24 DIAGNOSIS — D682 Hereditary deficiency of other clotting factors: Secondary | ICD-10-CM | POA: Diagnosis not present

## 2020-02-24 DIAGNOSIS — K863 Pseudocyst of pancreas: Secondary | ICD-10-CM | POA: Diagnosis not present

## 2020-02-24 DIAGNOSIS — E853 Secondary systemic amyloidosis: Secondary | ICD-10-CM | POA: Diagnosis not present

## 2020-02-24 DIAGNOSIS — I5032 Chronic diastolic (congestive) heart failure: Secondary | ICD-10-CM | POA: Diagnosis not present

## 2020-02-24 DIAGNOSIS — E854 Organ-limited amyloidosis: Secondary | ICD-10-CM | POA: Diagnosis not present

## 2020-02-24 DIAGNOSIS — I493 Ventricular premature depolarization: Secondary | ICD-10-CM | POA: Diagnosis not present

## 2020-02-24 DIAGNOSIS — A4159 Other Gram-negative sepsis: Secondary | ICD-10-CM | POA: Diagnosis not present

## 2020-02-24 DIAGNOSIS — R0902 Hypoxemia: Secondary | ICD-10-CM | POA: Diagnosis not present

## 2020-02-24 DIAGNOSIS — E1122 Type 2 diabetes mellitus with diabetic chronic kidney disease: Secondary | ICD-10-CM | POA: Diagnosis not present

## 2020-02-24 DIAGNOSIS — N179 Acute kidney failure, unspecified: Secondary | ICD-10-CM | POA: Diagnosis not present

## 2020-02-24 DIAGNOSIS — E1121 Type 2 diabetes mellitus with diabetic nephropathy: Secondary | ICD-10-CM | POA: Diagnosis not present

## 2020-02-24 DIAGNOSIS — I440 Atrioventricular block, first degree: Secondary | ICD-10-CM | POA: Diagnosis not present

## 2020-02-24 DIAGNOSIS — N178 Other acute kidney failure: Secondary | ICD-10-CM | POA: Diagnosis not present

## 2020-02-24 DIAGNOSIS — R069 Unspecified abnormalities of breathing: Secondary | ICD-10-CM | POA: Diagnosis not present

## 2020-02-24 DIAGNOSIS — E859 Amyloidosis, unspecified: Secondary | ICD-10-CM | POA: Diagnosis not present

## 2020-02-25 DIAGNOSIS — I071 Rheumatic tricuspid insufficiency: Secondary | ICD-10-CM | POA: Diagnosis not present

## 2020-02-25 DIAGNOSIS — R609 Edema, unspecified: Secondary | ICD-10-CM | POA: Diagnosis not present

## 2020-02-25 DIAGNOSIS — J90 Pleural effusion, not elsewhere classified: Secondary | ICD-10-CM | POA: Diagnosis not present

## 2020-02-25 DIAGNOSIS — Z7984 Long term (current) use of oral hypoglycemic drugs: Secondary | ICD-10-CM | POA: Diagnosis not present

## 2020-02-25 DIAGNOSIS — R808 Other proteinuria: Secondary | ICD-10-CM | POA: Diagnosis not present

## 2020-02-25 DIAGNOSIS — I5030 Unspecified diastolic (congestive) heart failure: Secondary | ICD-10-CM | POA: Diagnosis not present

## 2020-02-25 DIAGNOSIS — Z95828 Presence of other vascular implants and grafts: Secondary | ICD-10-CM | POA: Diagnosis not present

## 2020-02-25 DIAGNOSIS — D682 Hereditary deficiency of other clotting factors: Secondary | ICD-10-CM | POA: Diagnosis not present

## 2020-02-25 DIAGNOSIS — K922 Gastrointestinal hemorrhage, unspecified: Secondary | ICD-10-CM | POA: Diagnosis not present

## 2020-02-25 DIAGNOSIS — B961 Klebsiella pneumoniae [K. pneumoniae] as the cause of diseases classified elsewhere: Secondary | ICD-10-CM | POA: Diagnosis not present

## 2020-02-25 DIAGNOSIS — E039 Hypothyroidism, unspecified: Secondary | ICD-10-CM | POA: Diagnosis not present

## 2020-02-25 DIAGNOSIS — K219 Gastro-esophageal reflux disease without esophagitis: Secondary | ICD-10-CM | POA: Diagnosis not present

## 2020-02-25 DIAGNOSIS — E119 Type 2 diabetes mellitus without complications: Secondary | ICD-10-CM | POA: Diagnosis not present

## 2020-02-25 DIAGNOSIS — R7881 Bacteremia: Secondary | ICD-10-CM | POA: Diagnosis not present

## 2020-02-25 DIAGNOSIS — I110 Hypertensive heart disease with heart failure: Secondary | ICD-10-CM | POA: Diagnosis not present

## 2020-02-25 DIAGNOSIS — E8581 Light chain (AL) amyloidosis: Secondary | ICD-10-CM | POA: Diagnosis not present

## 2020-02-26 DIAGNOSIS — N2889 Other specified disorders of kidney and ureter: Secondary | ICD-10-CM | POA: Diagnosis not present

## 2020-02-26 DIAGNOSIS — I5030 Unspecified diastolic (congestive) heart failure: Secondary | ICD-10-CM | POA: Diagnosis not present

## 2020-02-26 DIAGNOSIS — M7989 Other specified soft tissue disorders: Secondary | ICD-10-CM | POA: Diagnosis not present

## 2020-02-26 DIAGNOSIS — N179 Acute kidney failure, unspecified: Secondary | ICD-10-CM | POA: Diagnosis not present

## 2020-02-26 DIAGNOSIS — I959 Hypotension, unspecified: Secondary | ICD-10-CM | POA: Diagnosis not present

## 2020-02-26 DIAGNOSIS — E119 Type 2 diabetes mellitus without complications: Secondary | ICD-10-CM | POA: Diagnosis not present

## 2020-02-26 DIAGNOSIS — R0902 Hypoxemia: Secondary | ICD-10-CM | POA: Diagnosis not present

## 2020-02-26 DIAGNOSIS — R7989 Other specified abnormal findings of blood chemistry: Secondary | ICD-10-CM | POA: Diagnosis not present

## 2020-02-26 DIAGNOSIS — I129 Hypertensive chronic kidney disease with stage 1 through stage 4 chronic kidney disease, or unspecified chronic kidney disease: Secondary | ICD-10-CM | POA: Diagnosis not present

## 2020-02-26 DIAGNOSIS — E1122 Type 2 diabetes mellitus with diabetic chronic kidney disease: Secondary | ICD-10-CM | POA: Diagnosis not present

## 2020-02-26 DIAGNOSIS — R808 Other proteinuria: Secondary | ICD-10-CM | POA: Diagnosis not present

## 2020-02-26 DIAGNOSIS — R6521 Severe sepsis with septic shock: Secondary | ICD-10-CM | POA: Diagnosis not present

## 2020-02-26 DIAGNOSIS — K922 Gastrointestinal hemorrhage, unspecified: Secondary | ICD-10-CM | POA: Diagnosis not present

## 2020-02-26 DIAGNOSIS — E8581 Light chain (AL) amyloidosis: Secondary | ICD-10-CM | POA: Diagnosis not present

## 2020-02-26 DIAGNOSIS — D682 Hereditary deficiency of other clotting factors: Secondary | ICD-10-CM | POA: Diagnosis not present

## 2020-02-26 DIAGNOSIS — B961 Klebsiella pneumoniae [K. pneumoniae] as the cause of diseases classified elsewhere: Secondary | ICD-10-CM | POA: Diagnosis not present

## 2020-02-26 DIAGNOSIS — E8809 Other disorders of plasma-protein metabolism, not elsewhere classified: Secondary | ICD-10-CM | POA: Diagnosis not present

## 2020-02-26 DIAGNOSIS — R7881 Bacteremia: Secondary | ICD-10-CM | POA: Diagnosis not present

## 2020-02-26 DIAGNOSIS — E871 Hypo-osmolality and hyponatremia: Secondary | ICD-10-CM | POA: Diagnosis not present

## 2020-02-26 DIAGNOSIS — E859 Amyloidosis, unspecified: Secondary | ICD-10-CM | POA: Diagnosis not present

## 2020-02-26 DIAGNOSIS — Z7984 Long term (current) use of oral hypoglycemic drugs: Secondary | ICD-10-CM | POA: Diagnosis not present

## 2020-02-26 DIAGNOSIS — N189 Chronic kidney disease, unspecified: Secondary | ICD-10-CM | POA: Diagnosis not present

## 2020-02-26 DIAGNOSIS — A419 Sepsis, unspecified organism: Secondary | ICD-10-CM | POA: Diagnosis not present

## 2020-02-26 DIAGNOSIS — J150 Pneumonia due to Klebsiella pneumoniae: Secondary | ICD-10-CM | POA: Diagnosis not present

## 2020-02-27 DIAGNOSIS — R7881 Bacteremia: Secondary | ICD-10-CM | POA: Diagnosis not present

## 2020-02-27 DIAGNOSIS — K922 Gastrointestinal hemorrhage, unspecified: Secondary | ICD-10-CM | POA: Diagnosis not present

## 2020-02-27 DIAGNOSIS — E119 Type 2 diabetes mellitus without complications: Secondary | ICD-10-CM | POA: Diagnosis not present

## 2020-02-27 DIAGNOSIS — N184 Chronic kidney disease, stage 4 (severe): Secondary | ICD-10-CM | POA: Diagnosis not present

## 2020-02-27 DIAGNOSIS — A419 Sepsis, unspecified organism: Secondary | ICD-10-CM | POA: Diagnosis not present

## 2020-02-27 DIAGNOSIS — Z7984 Long term (current) use of oral hypoglycemic drugs: Secondary | ICD-10-CM | POA: Diagnosis not present

## 2020-02-27 DIAGNOSIS — B961 Klebsiella pneumoniae [K. pneumoniae] as the cause of diseases classified elsewhere: Secondary | ICD-10-CM | POA: Diagnosis not present

## 2020-02-27 DIAGNOSIS — R808 Other proteinuria: Secondary | ICD-10-CM | POA: Diagnosis not present

## 2020-02-27 DIAGNOSIS — E859 Amyloidosis, unspecified: Secondary | ICD-10-CM | POA: Diagnosis not present

## 2020-02-27 DIAGNOSIS — E8581 Light chain (AL) amyloidosis: Secondary | ICD-10-CM | POA: Diagnosis not present

## 2020-02-27 DIAGNOSIS — J150 Pneumonia due to Klebsiella pneumoniae: Secondary | ICD-10-CM | POA: Diagnosis not present

## 2020-02-27 DIAGNOSIS — I5030 Unspecified diastolic (congestive) heart failure: Secondary | ICD-10-CM | POA: Diagnosis not present

## 2020-02-27 DIAGNOSIS — R6521 Severe sepsis with septic shock: Secondary | ICD-10-CM | POA: Diagnosis not present

## 2020-02-27 DIAGNOSIS — I959 Hypotension, unspecified: Secondary | ICD-10-CM | POA: Diagnosis not present

## 2020-02-27 DIAGNOSIS — D682 Hereditary deficiency of other clotting factors: Secondary | ICD-10-CM | POA: Diagnosis not present

## 2020-02-27 DIAGNOSIS — N179 Acute kidney failure, unspecified: Secondary | ICD-10-CM | POA: Diagnosis not present

## 2020-02-28 DIAGNOSIS — R7881 Bacteremia: Secondary | ICD-10-CM | POA: Diagnosis not present

## 2020-02-28 DIAGNOSIS — D682 Hereditary deficiency of other clotting factors: Secondary | ICD-10-CM | POA: Diagnosis not present

## 2020-02-28 DIAGNOSIS — K922 Gastrointestinal hemorrhage, unspecified: Secondary | ICD-10-CM | POA: Diagnosis not present

## 2020-02-28 DIAGNOSIS — E8581 Light chain (AL) amyloidosis: Secondary | ICD-10-CM | POA: Diagnosis not present

## 2020-02-28 DIAGNOSIS — N179 Acute kidney failure, unspecified: Secondary | ICD-10-CM | POA: Diagnosis not present

## 2020-02-28 DIAGNOSIS — Z7984 Long term (current) use of oral hypoglycemic drugs: Secondary | ICD-10-CM | POA: Diagnosis not present

## 2020-02-28 DIAGNOSIS — E119 Type 2 diabetes mellitus without complications: Secondary | ICD-10-CM | POA: Diagnosis not present

## 2020-02-28 DIAGNOSIS — B961 Klebsiella pneumoniae [K. pneumoniae] as the cause of diseases classified elsewhere: Secondary | ICD-10-CM | POA: Diagnosis not present

## 2020-02-28 DIAGNOSIS — R808 Other proteinuria: Secondary | ICD-10-CM | POA: Diagnosis not present

## 2020-02-29 DIAGNOSIS — R6521 Severe sepsis with septic shock: Secondary | ICD-10-CM | POA: Diagnosis not present

## 2020-02-29 DIAGNOSIS — Z794 Long term (current) use of insulin: Secondary | ICD-10-CM | POA: Diagnosis not present

## 2020-02-29 DIAGNOSIS — A4150 Gram-negative sepsis, unspecified: Secondary | ICD-10-CM | POA: Diagnosis not present

## 2020-02-29 DIAGNOSIS — R000 Tachycardia, unspecified: Secondary | ICD-10-CM | POA: Diagnosis not present

## 2020-02-29 DIAGNOSIS — N179 Acute kidney failure, unspecified: Secondary | ICD-10-CM | POA: Diagnosis not present

## 2020-02-29 DIAGNOSIS — E1121 Type 2 diabetes mellitus with diabetic nephropathy: Secondary | ICD-10-CM | POA: Diagnosis not present

## 2020-03-01 DIAGNOSIS — D681 Hereditary factor XI deficiency: Secondary | ICD-10-CM | POA: Diagnosis not present

## 2020-03-01 DIAGNOSIS — R6520 Severe sepsis without septic shock: Secondary | ICD-10-CM | POA: Diagnosis not present

## 2020-03-01 DIAGNOSIS — E1165 Type 2 diabetes mellitus with hyperglycemia: Secondary | ICD-10-CM | POA: Diagnosis not present

## 2020-03-01 DIAGNOSIS — K922 Gastrointestinal hemorrhage, unspecified: Secondary | ICD-10-CM | POA: Diagnosis not present

## 2020-03-01 DIAGNOSIS — Z794 Long term (current) use of insulin: Secondary | ICD-10-CM | POA: Diagnosis not present

## 2020-03-01 DIAGNOSIS — N178 Other acute kidney failure: Secondary | ICD-10-CM | POA: Diagnosis not present

## 2020-03-01 DIAGNOSIS — E1121 Type 2 diabetes mellitus with diabetic nephropathy: Secondary | ICD-10-CM | POA: Diagnosis not present

## 2020-03-01 DIAGNOSIS — R000 Tachycardia, unspecified: Secondary | ICD-10-CM | POA: Diagnosis not present

## 2020-03-01 DIAGNOSIS — D696 Thrombocytopenia, unspecified: Secondary | ICD-10-CM | POA: Diagnosis not present

## 2020-03-01 DIAGNOSIS — D649 Anemia, unspecified: Secondary | ICD-10-CM | POA: Diagnosis not present

## 2020-03-01 DIAGNOSIS — B961 Klebsiella pneumoniae [K. pneumoniae] as the cause of diseases classified elsewhere: Secondary | ICD-10-CM | POA: Diagnosis not present

## 2020-03-01 DIAGNOSIS — E854 Organ-limited amyloidosis: Secondary | ICD-10-CM | POA: Diagnosis not present

## 2020-03-01 DIAGNOSIS — N179 Acute kidney failure, unspecified: Secondary | ICD-10-CM | POA: Diagnosis not present

## 2020-03-01 DIAGNOSIS — A4150 Gram-negative sepsis, unspecified: Secondary | ICD-10-CM | POA: Diagnosis not present

## 2020-03-01 DIAGNOSIS — N049 Nephrotic syndrome with unspecified morphologic changes: Secondary | ICD-10-CM | POA: Diagnosis not present

## 2020-03-01 DIAGNOSIS — R6521 Severe sepsis with septic shock: Secondary | ICD-10-CM | POA: Diagnosis not present

## 2020-03-02 DIAGNOSIS — E1165 Type 2 diabetes mellitus with hyperglycemia: Secondary | ICD-10-CM | POA: Diagnosis not present

## 2020-03-02 DIAGNOSIS — D649 Anemia, unspecified: Secondary | ICD-10-CM | POA: Diagnosis not present

## 2020-03-02 DIAGNOSIS — E854 Organ-limited amyloidosis: Secondary | ICD-10-CM | POA: Diagnosis not present

## 2020-03-02 DIAGNOSIS — R6520 Severe sepsis without septic shock: Secondary | ICD-10-CM | POA: Diagnosis not present

## 2020-03-02 DIAGNOSIS — A4150 Gram-negative sepsis, unspecified: Secondary | ICD-10-CM | POA: Diagnosis not present

## 2020-03-02 DIAGNOSIS — B961 Klebsiella pneumoniae [K. pneumoniae] as the cause of diseases classified elsewhere: Secondary | ICD-10-CM | POA: Diagnosis not present

## 2020-03-02 DIAGNOSIS — N178 Other acute kidney failure: Secondary | ICD-10-CM | POA: Diagnosis not present

## 2020-03-02 DIAGNOSIS — R6521 Severe sepsis with septic shock: Secondary | ICD-10-CM | POA: Diagnosis not present

## 2020-03-02 DIAGNOSIS — K922 Gastrointestinal hemorrhage, unspecified: Secondary | ICD-10-CM | POA: Diagnosis not present

## 2020-03-02 DIAGNOSIS — D696 Thrombocytopenia, unspecified: Secondary | ICD-10-CM | POA: Diagnosis not present

## 2020-03-02 DIAGNOSIS — J9690 Respiratory failure, unspecified, unspecified whether with hypoxia or hypercapnia: Secondary | ICD-10-CM | POA: Diagnosis not present

## 2020-03-02 DIAGNOSIS — N179 Acute kidney failure, unspecified: Secondary | ICD-10-CM | POA: Diagnosis not present

## 2020-03-02 DIAGNOSIS — N049 Nephrotic syndrome with unspecified morphologic changes: Secondary | ICD-10-CM | POA: Diagnosis not present

## 2020-03-02 DIAGNOSIS — D681 Hereditary factor XI deficiency: Secondary | ICD-10-CM | POA: Diagnosis not present

## 2020-03-03 DIAGNOSIS — D696 Thrombocytopenia, unspecified: Secondary | ICD-10-CM | POA: Diagnosis not present

## 2020-03-03 DIAGNOSIS — E1165 Type 2 diabetes mellitus with hyperglycemia: Secondary | ICD-10-CM | POA: Diagnosis not present

## 2020-03-03 DIAGNOSIS — R6521 Severe sepsis with septic shock: Secondary | ICD-10-CM | POA: Diagnosis not present

## 2020-03-03 DIAGNOSIS — A419 Sepsis, unspecified organism: Secondary | ICD-10-CM | POA: Diagnosis not present

## 2020-03-03 DIAGNOSIS — N049 Nephrotic syndrome with unspecified morphologic changes: Secondary | ICD-10-CM | POA: Diagnosis not present

## 2020-03-03 DIAGNOSIS — D681 Hereditary factor XI deficiency: Secondary | ICD-10-CM | POA: Diagnosis not present

## 2020-03-03 DIAGNOSIS — R6520 Severe sepsis without septic shock: Secondary | ICD-10-CM | POA: Diagnosis not present

## 2020-03-03 DIAGNOSIS — N179 Acute kidney failure, unspecified: Secondary | ICD-10-CM | POA: Diagnosis not present

## 2020-03-03 DIAGNOSIS — E854 Organ-limited amyloidosis: Secondary | ICD-10-CM | POA: Diagnosis not present

## 2020-03-03 DIAGNOSIS — Z794 Long term (current) use of insulin: Secondary | ICD-10-CM | POA: Diagnosis not present

## 2020-03-03 DIAGNOSIS — A4150 Gram-negative sepsis, unspecified: Secondary | ICD-10-CM | POA: Diagnosis not present

## 2020-03-03 DIAGNOSIS — B961 Klebsiella pneumoniae [K. pneumoniae] as the cause of diseases classified elsewhere: Secondary | ICD-10-CM | POA: Diagnosis not present

## 2020-03-03 DIAGNOSIS — K922 Gastrointestinal hemorrhage, unspecified: Secondary | ICD-10-CM | POA: Diagnosis not present

## 2020-03-03 DIAGNOSIS — E1121 Type 2 diabetes mellitus with diabetic nephropathy: Secondary | ICD-10-CM | POA: Diagnosis not present

## 2020-03-03 DIAGNOSIS — N178 Other acute kidney failure: Secondary | ICD-10-CM | POA: Diagnosis not present

## 2020-03-03 DIAGNOSIS — D649 Anemia, unspecified: Secondary | ICD-10-CM | POA: Diagnosis not present

## 2020-03-03 DIAGNOSIS — N2889 Other specified disorders of kidney and ureter: Secondary | ICD-10-CM | POA: Diagnosis not present

## 2020-03-04 DIAGNOSIS — E8581 Light chain (AL) amyloidosis: Secondary | ICD-10-CM | POA: Diagnosis not present

## 2020-03-04 DIAGNOSIS — N184 Chronic kidney disease, stage 4 (severe): Secondary | ICD-10-CM | POA: Diagnosis not present

## 2020-03-04 DIAGNOSIS — A4150 Gram-negative sepsis, unspecified: Secondary | ICD-10-CM | POA: Diagnosis not present

## 2020-03-04 DIAGNOSIS — E854 Organ-limited amyloidosis: Secondary | ICD-10-CM | POA: Diagnosis not present

## 2020-03-04 DIAGNOSIS — D696 Thrombocytopenia, unspecified: Secondary | ICD-10-CM | POA: Diagnosis not present

## 2020-03-04 DIAGNOSIS — R6520 Severe sepsis without septic shock: Secondary | ICD-10-CM | POA: Diagnosis not present

## 2020-03-04 DIAGNOSIS — B961 Klebsiella pneumoniae [K. pneumoniae] as the cause of diseases classified elsewhere: Secondary | ICD-10-CM | POA: Diagnosis not present

## 2020-03-04 DIAGNOSIS — Z4659 Encounter for fitting and adjustment of other gastrointestinal appliance and device: Secondary | ICD-10-CM | POA: Diagnosis not present

## 2020-03-04 DIAGNOSIS — N049 Nephrotic syndrome with unspecified morphologic changes: Secondary | ICD-10-CM | POA: Diagnosis not present

## 2020-03-04 DIAGNOSIS — R1032 Left lower quadrant pain: Secondary | ICD-10-CM | POA: Diagnosis not present

## 2020-03-04 DIAGNOSIS — E1165 Type 2 diabetes mellitus with hyperglycemia: Secondary | ICD-10-CM | POA: Diagnosis not present

## 2020-03-04 DIAGNOSIS — D681 Hereditary factor XI deficiency: Secondary | ICD-10-CM | POA: Diagnosis not present

## 2020-03-04 DIAGNOSIS — K921 Melena: Secondary | ICD-10-CM | POA: Diagnosis not present

## 2020-03-04 DIAGNOSIS — J9601 Acute respiratory failure with hypoxia: Secondary | ICD-10-CM | POA: Diagnosis not present

## 2020-03-04 DIAGNOSIS — K922 Gastrointestinal hemorrhage, unspecified: Secondary | ICD-10-CM | POA: Diagnosis not present

## 2020-03-04 DIAGNOSIS — E8770 Fluid overload, unspecified: Secondary | ICD-10-CM | POA: Diagnosis not present

## 2020-03-04 DIAGNOSIS — D649 Anemia, unspecified: Secondary | ICD-10-CM | POA: Diagnosis not present

## 2020-03-04 DIAGNOSIS — C9000 Multiple myeloma not having achieved remission: Secondary | ICD-10-CM | POA: Diagnosis not present

## 2020-03-04 DIAGNOSIS — N179 Acute kidney failure, unspecified: Secondary | ICD-10-CM | POA: Diagnosis not present

## 2020-03-04 DIAGNOSIS — N178 Other acute kidney failure: Secondary | ICD-10-CM | POA: Diagnosis not present

## 2020-03-04 DIAGNOSIS — R6521 Severe sepsis with septic shock: Secondary | ICD-10-CM | POA: Diagnosis not present

## 2020-03-05 DIAGNOSIS — D696 Thrombocytopenia, unspecified: Secondary | ICD-10-CM | POA: Diagnosis not present

## 2020-03-05 DIAGNOSIS — E8581 Light chain (AL) amyloidosis: Secondary | ICD-10-CM | POA: Diagnosis not present

## 2020-03-05 DIAGNOSIS — C9000 Multiple myeloma not having achieved remission: Secondary | ICD-10-CM | POA: Diagnosis not present

## 2020-03-05 DIAGNOSIS — I129 Hypertensive chronic kidney disease with stage 1 through stage 4 chronic kidney disease, or unspecified chronic kidney disease: Secondary | ICD-10-CM | POA: Diagnosis not present

## 2020-03-05 DIAGNOSIS — J810 Acute pulmonary edema: Secondary | ICD-10-CM | POA: Diagnosis not present

## 2020-03-05 DIAGNOSIS — D649 Anemia, unspecified: Secondary | ICD-10-CM | POA: Diagnosis not present

## 2020-03-05 DIAGNOSIS — D681 Hereditary factor XI deficiency: Secondary | ICD-10-CM | POA: Diagnosis not present

## 2020-03-05 DIAGNOSIS — E854 Organ-limited amyloidosis: Secondary | ICD-10-CM | POA: Diagnosis not present

## 2020-03-05 DIAGNOSIS — J9601 Acute respiratory failure with hypoxia: Secondary | ICD-10-CM | POA: Diagnosis not present

## 2020-03-05 DIAGNOSIS — A4150 Gram-negative sepsis, unspecified: Secondary | ICD-10-CM | POA: Diagnosis not present

## 2020-03-05 DIAGNOSIS — J90 Pleural effusion, not elsewhere classified: Secondary | ICD-10-CM | POA: Diagnosis not present

## 2020-03-05 DIAGNOSIS — N049 Nephrotic syndrome with unspecified morphologic changes: Secondary | ICD-10-CM | POA: Diagnosis not present

## 2020-03-05 DIAGNOSIS — K922 Gastrointestinal hemorrhage, unspecified: Secondary | ICD-10-CM | POA: Diagnosis not present

## 2020-03-05 DIAGNOSIS — E1122 Type 2 diabetes mellitus with diabetic chronic kidney disease: Secondary | ICD-10-CM | POA: Diagnosis not present

## 2020-03-05 DIAGNOSIS — A414 Sepsis due to anaerobes: Secondary | ICD-10-CM | POA: Diagnosis not present

## 2020-03-05 DIAGNOSIS — N178 Other acute kidney failure: Secondary | ICD-10-CM | POA: Diagnosis not present

## 2020-03-05 DIAGNOSIS — B961 Klebsiella pneumoniae [K. pneumoniae] as the cause of diseases classified elsewhere: Secondary | ICD-10-CM | POA: Diagnosis not present

## 2020-03-05 DIAGNOSIS — R6521 Severe sepsis with septic shock: Secondary | ICD-10-CM | POA: Diagnosis not present

## 2020-03-05 DIAGNOSIS — N179 Acute kidney failure, unspecified: Secondary | ICD-10-CM | POA: Diagnosis not present

## 2020-03-05 DIAGNOSIS — N184 Chronic kidney disease, stage 4 (severe): Secondary | ICD-10-CM | POA: Diagnosis not present

## 2020-03-05 DIAGNOSIS — R6520 Severe sepsis without septic shock: Secondary | ICD-10-CM | POA: Diagnosis not present

## 2020-03-05 DIAGNOSIS — E1165 Type 2 diabetes mellitus with hyperglycemia: Secondary | ICD-10-CM | POA: Diagnosis not present

## 2020-03-06 DIAGNOSIS — D681 Hereditary factor XI deficiency: Secondary | ICD-10-CM | POA: Diagnosis not present

## 2020-03-06 DIAGNOSIS — R609 Edema, unspecified: Secondary | ICD-10-CM | POA: Diagnosis not present

## 2020-03-06 DIAGNOSIS — R6521 Severe sepsis with septic shock: Secondary | ICD-10-CM | POA: Diagnosis not present

## 2020-03-06 DIAGNOSIS — E859 Amyloidosis, unspecified: Secondary | ICD-10-CM | POA: Diagnosis not present

## 2020-03-06 DIAGNOSIS — A4189 Other specified sepsis: Secondary | ICD-10-CM | POA: Diagnosis not present

## 2020-03-06 DIAGNOSIS — I129 Hypertensive chronic kidney disease with stage 1 through stage 4 chronic kidney disease, or unspecified chronic kidney disease: Secondary | ICD-10-CM | POA: Diagnosis not present

## 2020-03-06 DIAGNOSIS — B961 Klebsiella pneumoniae [K. pneumoniae] as the cause of diseases classified elsewhere: Secondary | ICD-10-CM | POA: Diagnosis not present

## 2020-03-06 DIAGNOSIS — K922 Gastrointestinal hemorrhage, unspecified: Secondary | ICD-10-CM | POA: Diagnosis not present

## 2020-03-06 DIAGNOSIS — R6520 Severe sepsis without septic shock: Secondary | ICD-10-CM | POA: Diagnosis not present

## 2020-03-06 DIAGNOSIS — E1165 Type 2 diabetes mellitus with hyperglycemia: Secondary | ICD-10-CM | POA: Diagnosis not present

## 2020-03-06 DIAGNOSIS — N179 Acute kidney failure, unspecified: Secondary | ICD-10-CM | POA: Diagnosis not present

## 2020-03-06 DIAGNOSIS — N049 Nephrotic syndrome with unspecified morphologic changes: Secondary | ICD-10-CM | POA: Diagnosis not present

## 2020-03-06 DIAGNOSIS — E854 Organ-limited amyloidosis: Secondary | ICD-10-CM | POA: Diagnosis not present

## 2020-03-06 DIAGNOSIS — D696 Thrombocytopenia, unspecified: Secondary | ICD-10-CM | POA: Diagnosis not present

## 2020-03-06 DIAGNOSIS — D649 Anemia, unspecified: Secondary | ICD-10-CM | POA: Diagnosis not present

## 2020-03-06 DIAGNOSIS — N189 Chronic kidney disease, unspecified: Secondary | ICD-10-CM | POA: Diagnosis not present

## 2020-03-06 DIAGNOSIS — E1122 Type 2 diabetes mellitus with diabetic chronic kidney disease: Secondary | ICD-10-CM | POA: Diagnosis not present

## 2020-03-06 DIAGNOSIS — N178 Other acute kidney failure: Secondary | ICD-10-CM | POA: Diagnosis not present

## 2020-03-07 DIAGNOSIS — A4189 Other specified sepsis: Secondary | ICD-10-CM | POA: Diagnosis not present

## 2020-03-07 DIAGNOSIS — R6521 Severe sepsis with septic shock: Secondary | ICD-10-CM | POA: Diagnosis not present

## 2020-03-07 DIAGNOSIS — E859 Amyloidosis, unspecified: Secondary | ICD-10-CM | POA: Diagnosis not present

## 2020-03-07 DIAGNOSIS — N179 Acute kidney failure, unspecified: Secondary | ICD-10-CM | POA: Diagnosis not present

## 2020-03-08 DIAGNOSIS — R6520 Severe sepsis without septic shock: Secondary | ICD-10-CM | POA: Diagnosis not present

## 2020-03-08 DIAGNOSIS — D649 Anemia, unspecified: Secondary | ICD-10-CM | POA: Diagnosis not present

## 2020-03-08 DIAGNOSIS — A4189 Other specified sepsis: Secondary | ICD-10-CM | POA: Diagnosis not present

## 2020-03-08 DIAGNOSIS — N179 Acute kidney failure, unspecified: Secondary | ICD-10-CM | POA: Diagnosis not present

## 2020-03-08 DIAGNOSIS — E8581 Light chain (AL) amyloidosis: Secondary | ICD-10-CM | POA: Diagnosis not present

## 2020-03-08 DIAGNOSIS — E1122 Type 2 diabetes mellitus with diabetic chronic kidney disease: Secondary | ICD-10-CM | POA: Diagnosis not present

## 2020-03-08 DIAGNOSIS — N049 Nephrotic syndrome with unspecified morphologic changes: Secondary | ICD-10-CM | POA: Diagnosis not present

## 2020-03-08 DIAGNOSIS — B961 Klebsiella pneumoniae [K. pneumoniae] as the cause of diseases classified elsewhere: Secondary | ICD-10-CM | POA: Diagnosis not present

## 2020-03-08 DIAGNOSIS — K922 Gastrointestinal hemorrhage, unspecified: Secondary | ICD-10-CM | POA: Diagnosis not present

## 2020-03-08 DIAGNOSIS — E8770 Fluid overload, unspecified: Secondary | ICD-10-CM | POA: Diagnosis not present

## 2020-03-08 DIAGNOSIS — A4159 Other Gram-negative sepsis: Secondary | ICD-10-CM | POA: Diagnosis not present

## 2020-03-08 DIAGNOSIS — E1165 Type 2 diabetes mellitus with hyperglycemia: Secondary | ICD-10-CM | POA: Diagnosis not present

## 2020-03-08 DIAGNOSIS — I129 Hypertensive chronic kidney disease with stage 1 through stage 4 chronic kidney disease, or unspecified chronic kidney disease: Secondary | ICD-10-CM | POA: Diagnosis not present

## 2020-03-08 DIAGNOSIS — C9000 Multiple myeloma not having achieved remission: Secondary | ICD-10-CM | POA: Diagnosis not present

## 2020-03-08 DIAGNOSIS — E854 Organ-limited amyloidosis: Secondary | ICD-10-CM | POA: Diagnosis not present

## 2020-03-08 DIAGNOSIS — N178 Other acute kidney failure: Secondary | ICD-10-CM | POA: Diagnosis not present

## 2020-03-08 DIAGNOSIS — R6521 Severe sepsis with septic shock: Secondary | ICD-10-CM | POA: Diagnosis not present

## 2020-03-08 DIAGNOSIS — D696 Thrombocytopenia, unspecified: Secondary | ICD-10-CM | POA: Diagnosis not present

## 2020-03-08 DIAGNOSIS — N184 Chronic kidney disease, stage 4 (severe): Secondary | ICD-10-CM | POA: Diagnosis not present

## 2020-03-08 DIAGNOSIS — D681 Hereditary factor XI deficiency: Secondary | ICD-10-CM | POA: Diagnosis not present

## 2020-03-09 DIAGNOSIS — R6520 Severe sepsis without septic shock: Secondary | ICD-10-CM | POA: Diagnosis not present

## 2020-03-09 DIAGNOSIS — E1165 Type 2 diabetes mellitus with hyperglycemia: Secondary | ICD-10-CM | POA: Diagnosis not present

## 2020-03-09 DIAGNOSIS — D649 Anemia, unspecified: Secondary | ICD-10-CM | POA: Diagnosis not present

## 2020-03-09 DIAGNOSIS — N179 Acute kidney failure, unspecified: Secondary | ICD-10-CM | POA: Diagnosis not present

## 2020-03-09 DIAGNOSIS — A4189 Other specified sepsis: Secondary | ICD-10-CM | POA: Diagnosis not present

## 2020-03-09 DIAGNOSIS — N049 Nephrotic syndrome with unspecified morphologic changes: Secondary | ICD-10-CM | POA: Diagnosis not present

## 2020-03-09 DIAGNOSIS — D681 Hereditary factor XI deficiency: Secondary | ICD-10-CM | POA: Diagnosis not present

## 2020-03-09 DIAGNOSIS — N059 Unspecified nephritic syndrome with unspecified morphologic changes: Secondary | ICD-10-CM | POA: Diagnosis not present

## 2020-03-09 DIAGNOSIS — D696 Thrombocytopenia, unspecified: Secondary | ICD-10-CM | POA: Diagnosis not present

## 2020-03-09 DIAGNOSIS — E854 Organ-limited amyloidosis: Secondary | ICD-10-CM | POA: Diagnosis not present

## 2020-03-09 DIAGNOSIS — K922 Gastrointestinal hemorrhage, unspecified: Secondary | ICD-10-CM | POA: Diagnosis not present

## 2020-03-09 DIAGNOSIS — N178 Other acute kidney failure: Secondary | ICD-10-CM | POA: Diagnosis not present

## 2020-03-09 DIAGNOSIS — R6521 Severe sepsis with septic shock: Secondary | ICD-10-CM | POA: Diagnosis not present

## 2020-03-09 DIAGNOSIS — B961 Klebsiella pneumoniae [K. pneumoniae] as the cause of diseases classified elsewhere: Secondary | ICD-10-CM | POA: Diagnosis not present

## 2020-03-10 DIAGNOSIS — I482 Chronic atrial fibrillation, unspecified: Secondary | ICD-10-CM | POA: Diagnosis not present

## 2020-03-10 DIAGNOSIS — E1165 Type 2 diabetes mellitus with hyperglycemia: Secondary | ICD-10-CM | POA: Diagnosis not present

## 2020-03-10 DIAGNOSIS — N179 Acute kidney failure, unspecified: Secondary | ICD-10-CM | POA: Diagnosis not present

## 2020-03-10 DIAGNOSIS — N178 Other acute kidney failure: Secondary | ICD-10-CM | POA: Diagnosis not present

## 2020-03-10 DIAGNOSIS — D649 Anemia, unspecified: Secondary | ICD-10-CM | POA: Diagnosis not present

## 2020-03-10 DIAGNOSIS — B961 Klebsiella pneumoniae [K. pneumoniae] as the cause of diseases classified elsewhere: Secondary | ICD-10-CM | POA: Diagnosis not present

## 2020-03-10 DIAGNOSIS — R6520 Severe sepsis without septic shock: Secondary | ICD-10-CM | POA: Diagnosis not present

## 2020-03-10 DIAGNOSIS — D696 Thrombocytopenia, unspecified: Secondary | ICD-10-CM | POA: Diagnosis not present

## 2020-03-10 DIAGNOSIS — E854 Organ-limited amyloidosis: Secondary | ICD-10-CM | POA: Diagnosis not present

## 2020-03-10 DIAGNOSIS — K922 Gastrointestinal hemorrhage, unspecified: Secondary | ICD-10-CM | POA: Diagnosis not present

## 2020-03-10 DIAGNOSIS — N049 Nephrotic syndrome with unspecified morphologic changes: Secondary | ICD-10-CM | POA: Diagnosis not present

## 2020-03-10 DIAGNOSIS — D681 Hereditary factor XI deficiency: Secondary | ICD-10-CM | POA: Diagnosis not present

## 2020-03-11 DIAGNOSIS — K922 Gastrointestinal hemorrhage, unspecified: Secondary | ICD-10-CM | POA: Diagnosis not present

## 2020-03-11 DIAGNOSIS — E1165 Type 2 diabetes mellitus with hyperglycemia: Secondary | ICD-10-CM | POA: Diagnosis not present

## 2020-03-11 DIAGNOSIS — N179 Acute kidney failure, unspecified: Secondary | ICD-10-CM | POA: Diagnosis not present

## 2020-03-11 DIAGNOSIS — E854 Organ-limited amyloidosis: Secondary | ICD-10-CM | POA: Diagnosis not present

## 2020-03-11 DIAGNOSIS — B961 Klebsiella pneumoniae [K. pneumoniae] as the cause of diseases classified elsewhere: Secondary | ICD-10-CM | POA: Diagnosis not present

## 2020-03-11 DIAGNOSIS — D696 Thrombocytopenia, unspecified: Secondary | ICD-10-CM | POA: Diagnosis not present

## 2020-03-11 DIAGNOSIS — N178 Other acute kidney failure: Secondary | ICD-10-CM | POA: Diagnosis not present

## 2020-03-11 DIAGNOSIS — A4189 Other specified sepsis: Secondary | ICD-10-CM | POA: Diagnosis not present

## 2020-03-11 DIAGNOSIS — D681 Hereditary factor XI deficiency: Secondary | ICD-10-CM | POA: Diagnosis not present

## 2020-03-11 DIAGNOSIS — R6521 Severe sepsis with septic shock: Secondary | ICD-10-CM | POA: Diagnosis not present

## 2020-03-11 DIAGNOSIS — D649 Anemia, unspecified: Secondary | ICD-10-CM | POA: Diagnosis not present

## 2020-03-11 DIAGNOSIS — R6520 Severe sepsis without septic shock: Secondary | ICD-10-CM | POA: Diagnosis not present

## 2020-03-11 DIAGNOSIS — N049 Nephrotic syndrome with unspecified morphologic changes: Secondary | ICD-10-CM | POA: Diagnosis not present

## 2020-03-12 DIAGNOSIS — K922 Gastrointestinal hemorrhage, unspecified: Secondary | ICD-10-CM | POA: Diagnosis not present

## 2020-03-12 DIAGNOSIS — E1165 Type 2 diabetes mellitus with hyperglycemia: Secondary | ICD-10-CM | POA: Diagnosis not present

## 2020-03-12 DIAGNOSIS — E039 Hypothyroidism, unspecified: Secondary | ICD-10-CM | POA: Diagnosis not present

## 2020-03-12 DIAGNOSIS — B961 Klebsiella pneumoniae [K. pneumoniae] as the cause of diseases classified elsewhere: Secondary | ICD-10-CM | POA: Diagnosis not present

## 2020-03-12 DIAGNOSIS — E854 Organ-limited amyloidosis: Secondary | ICD-10-CM | POA: Diagnosis not present

## 2020-03-12 DIAGNOSIS — D681 Hereditary factor XI deficiency: Secondary | ICD-10-CM | POA: Diagnosis not present

## 2020-03-12 DIAGNOSIS — N178 Other acute kidney failure: Secondary | ICD-10-CM | POA: Diagnosis not present

## 2020-03-12 DIAGNOSIS — D696 Thrombocytopenia, unspecified: Secondary | ICD-10-CM | POA: Diagnosis not present

## 2020-03-12 DIAGNOSIS — D649 Anemia, unspecified: Secondary | ICD-10-CM | POA: Diagnosis not present

## 2020-03-12 DIAGNOSIS — N049 Nephrotic syndrome with unspecified morphologic changes: Secondary | ICD-10-CM | POA: Diagnosis not present

## 2020-03-12 DIAGNOSIS — N179 Acute kidney failure, unspecified: Secondary | ICD-10-CM | POA: Diagnosis not present

## 2020-03-12 DIAGNOSIS — R6520 Severe sepsis without septic shock: Secondary | ICD-10-CM | POA: Diagnosis not present

## 2020-03-12 DIAGNOSIS — E119 Type 2 diabetes mellitus without complications: Secondary | ICD-10-CM | POA: Diagnosis not present

## 2020-03-13 DIAGNOSIS — N049 Nephrotic syndrome with unspecified morphologic changes: Secondary | ICD-10-CM | POA: Diagnosis not present

## 2020-03-13 DIAGNOSIS — N179 Acute kidney failure, unspecified: Secondary | ICD-10-CM | POA: Diagnosis not present

## 2020-03-13 DIAGNOSIS — E039 Hypothyroidism, unspecified: Secondary | ICD-10-CM | POA: Diagnosis not present

## 2020-03-13 DIAGNOSIS — E119 Type 2 diabetes mellitus without complications: Secondary | ICD-10-CM | POA: Diagnosis not present

## 2020-03-14 DIAGNOSIS — I482 Chronic atrial fibrillation, unspecified: Secondary | ICD-10-CM | POA: Diagnosis not present

## 2020-03-14 DIAGNOSIS — R197 Diarrhea, unspecified: Secondary | ICD-10-CM | POA: Diagnosis not present

## 2020-03-14 DIAGNOSIS — E854 Organ-limited amyloidosis: Secondary | ICD-10-CM | POA: Diagnosis not present

## 2020-03-14 DIAGNOSIS — D681 Hereditary factor XI deficiency: Secondary | ICD-10-CM | POA: Diagnosis not present

## 2020-03-14 DIAGNOSIS — D649 Anemia, unspecified: Secondary | ICD-10-CM | POA: Diagnosis not present

## 2020-03-14 DIAGNOSIS — B961 Klebsiella pneumoniae [K. pneumoniae] as the cause of diseases classified elsewhere: Secondary | ICD-10-CM | POA: Diagnosis not present

## 2020-03-14 DIAGNOSIS — N049 Nephrotic syndrome with unspecified morphologic changes: Secondary | ICD-10-CM | POA: Diagnosis not present

## 2020-03-14 DIAGNOSIS — N178 Other acute kidney failure: Secondary | ICD-10-CM | POA: Diagnosis not present

## 2020-03-14 DIAGNOSIS — E1165 Type 2 diabetes mellitus with hyperglycemia: Secondary | ICD-10-CM | POA: Diagnosis not present

## 2020-03-14 DIAGNOSIS — N179 Acute kidney failure, unspecified: Secondary | ICD-10-CM | POA: Diagnosis not present

## 2020-03-14 DIAGNOSIS — D696 Thrombocytopenia, unspecified: Secondary | ICD-10-CM | POA: Diagnosis not present

## 2020-03-14 DIAGNOSIS — K922 Gastrointestinal hemorrhage, unspecified: Secondary | ICD-10-CM | POA: Diagnosis not present

## 2020-03-15 DIAGNOSIS — I482 Chronic atrial fibrillation, unspecified: Secondary | ICD-10-CM | POA: Diagnosis not present

## 2020-03-15 DIAGNOSIS — R6520 Severe sepsis without septic shock: Secondary | ICD-10-CM | POA: Diagnosis not present

## 2020-03-15 DIAGNOSIS — N179 Acute kidney failure, unspecified: Secondary | ICD-10-CM | POA: Diagnosis not present

## 2020-03-15 DIAGNOSIS — B961 Klebsiella pneumoniae [K. pneumoniae] as the cause of diseases classified elsewhere: Secondary | ICD-10-CM | POA: Diagnosis not present

## 2020-03-15 DIAGNOSIS — J9601 Acute respiratory failure with hypoxia: Secondary | ICD-10-CM | POA: Diagnosis not present

## 2020-03-15 DIAGNOSIS — A4189 Other specified sepsis: Secondary | ICD-10-CM | POA: Diagnosis not present

## 2020-03-15 DIAGNOSIS — R6521 Severe sepsis with septic shock: Secondary | ICD-10-CM | POA: Diagnosis not present

## 2020-03-15 DIAGNOSIS — A4150 Gram-negative sepsis, unspecified: Secondary | ICD-10-CM | POA: Diagnosis not present

## 2020-03-15 DIAGNOSIS — N184 Chronic kidney disease, stage 4 (severe): Secondary | ICD-10-CM | POA: Diagnosis not present

## 2020-03-15 DIAGNOSIS — R197 Diarrhea, unspecified: Secondary | ICD-10-CM | POA: Diagnosis not present

## 2020-03-15 DIAGNOSIS — E859 Amyloidosis, unspecified: Secondary | ICD-10-CM | POA: Diagnosis not present

## 2020-03-15 DIAGNOSIS — R7881 Bacteremia: Secondary | ICD-10-CM | POA: Diagnosis not present

## 2020-03-16 DIAGNOSIS — R6521 Severe sepsis with septic shock: Secondary | ICD-10-CM | POA: Diagnosis not present

## 2020-03-16 DIAGNOSIS — I482 Chronic atrial fibrillation, unspecified: Secondary | ICD-10-CM | POA: Diagnosis not present

## 2020-03-16 DIAGNOSIS — A4189 Other specified sepsis: Secondary | ICD-10-CM | POA: Diagnosis not present

## 2020-03-16 DIAGNOSIS — R197 Diarrhea, unspecified: Secondary | ICD-10-CM | POA: Diagnosis not present

## 2020-03-16 DIAGNOSIS — R6520 Severe sepsis without septic shock: Secondary | ICD-10-CM | POA: Diagnosis not present

## 2020-03-16 DIAGNOSIS — J9601 Acute respiratory failure with hypoxia: Secondary | ICD-10-CM | POA: Diagnosis not present

## 2020-03-17 DIAGNOSIS — J9601 Acute respiratory failure with hypoxia: Secondary | ICD-10-CM | POA: Diagnosis not present

## 2020-03-17 DIAGNOSIS — N184 Chronic kidney disease, stage 4 (severe): Secondary | ICD-10-CM | POA: Diagnosis not present

## 2020-03-17 DIAGNOSIS — E859 Amyloidosis, unspecified: Secondary | ICD-10-CM | POA: Diagnosis not present

## 2020-03-17 DIAGNOSIS — R7881 Bacteremia: Secondary | ICD-10-CM | POA: Diagnosis not present

## 2020-03-17 DIAGNOSIS — A4189 Other specified sepsis: Secondary | ICD-10-CM | POA: Diagnosis not present

## 2020-03-17 DIAGNOSIS — R6521 Severe sepsis with septic shock: Secondary | ICD-10-CM | POA: Diagnosis not present

## 2020-03-17 DIAGNOSIS — R1032 Left lower quadrant pain: Secondary | ICD-10-CM | POA: Diagnosis not present

## 2020-03-17 DIAGNOSIS — R069 Unspecified abnormalities of breathing: Secondary | ICD-10-CM | POA: Diagnosis not present

## 2020-03-17 DIAGNOSIS — R6520 Severe sepsis without septic shock: Secondary | ICD-10-CM | POA: Diagnosis not present

## 2020-03-17 DIAGNOSIS — N179 Acute kidney failure, unspecified: Secondary | ICD-10-CM | POA: Diagnosis not present

## 2020-03-17 DIAGNOSIS — K921 Melena: Secondary | ICD-10-CM | POA: Diagnosis not present

## 2020-03-17 DIAGNOSIS — A4150 Gram-negative sepsis, unspecified: Secondary | ICD-10-CM | POA: Diagnosis not present

## 2020-03-17 DIAGNOSIS — R197 Diarrhea, unspecified: Secondary | ICD-10-CM | POA: Diagnosis not present

## 2020-03-17 DIAGNOSIS — E8581 Light chain (AL) amyloidosis: Secondary | ICD-10-CM | POA: Diagnosis not present

## 2020-03-17 DIAGNOSIS — I482 Chronic atrial fibrillation, unspecified: Secondary | ICD-10-CM | POA: Diagnosis not present

## 2020-03-17 DIAGNOSIS — B961 Klebsiella pneumoniae [K. pneumoniae] as the cause of diseases classified elsewhere: Secondary | ICD-10-CM | POA: Diagnosis not present

## 2020-03-29 DIAGNOSIS — N184 Chronic kidney disease, stage 4 (severe): Secondary | ICD-10-CM | POA: Diagnosis not present

## 2020-03-29 DIAGNOSIS — D682 Hereditary deficiency of other clotting factors: Secondary | ICD-10-CM | POA: Diagnosis not present

## 2020-03-29 DIAGNOSIS — Z5111 Encounter for antineoplastic chemotherapy: Secondary | ICD-10-CM | POA: Diagnosis not present

## 2020-03-29 DIAGNOSIS — E1122 Type 2 diabetes mellitus with diabetic chronic kidney disease: Secondary | ICD-10-CM | POA: Diagnosis not present

## 2020-03-29 DIAGNOSIS — D696 Thrombocytopenia, unspecified: Secondary | ICD-10-CM | POA: Diagnosis not present

## 2020-03-29 DIAGNOSIS — C9000 Multiple myeloma not having achieved remission: Secondary | ICD-10-CM | POA: Diagnosis not present

## 2020-03-29 DIAGNOSIS — E8581 Light chain (AL) amyloidosis: Secondary | ICD-10-CM | POA: Diagnosis not present

## 2020-03-29 DIAGNOSIS — Z794 Long term (current) use of insulin: Secondary | ICD-10-CM | POA: Diagnosis not present

## 2020-03-29 DIAGNOSIS — D649 Anemia, unspecified: Secondary | ICD-10-CM | POA: Diagnosis not present

## 2020-03-29 DIAGNOSIS — D72829 Elevated white blood cell count, unspecified: Secondary | ICD-10-CM | POA: Diagnosis not present

## 2020-04-05 DIAGNOSIS — E8581 Light chain (AL) amyloidosis: Secondary | ICD-10-CM | POA: Diagnosis not present

## 2020-04-05 DIAGNOSIS — Z79899 Other long term (current) drug therapy: Secondary | ICD-10-CM | POA: Diagnosis not present

## 2020-04-14 DIAGNOSIS — N186 End stage renal disease: Secondary | ICD-10-CM | POA: Diagnosis not present

## 2020-04-14 DIAGNOSIS — E8581 Light chain (AL) amyloidosis: Secondary | ICD-10-CM | POA: Diagnosis not present

## 2020-04-14 DIAGNOSIS — E039 Hypothyroidism, unspecified: Secondary | ICD-10-CM | POA: Diagnosis not present

## 2020-04-14 DIAGNOSIS — C9000 Multiple myeloma not having achieved remission: Secondary | ICD-10-CM | POA: Diagnosis not present

## 2020-04-14 DIAGNOSIS — N049 Nephrotic syndrome with unspecified morphologic changes: Secondary | ICD-10-CM | POA: Diagnosis not present

## 2020-04-14 DIAGNOSIS — E854 Organ-limited amyloidosis: Secondary | ICD-10-CM | POA: Diagnosis not present

## 2020-04-14 DIAGNOSIS — Z992 Dependence on renal dialysis: Secondary | ICD-10-CM | POA: Diagnosis not present

## 2020-04-14 DIAGNOSIS — E1121 Type 2 diabetes mellitus with diabetic nephropathy: Secondary | ICD-10-CM | POA: Diagnosis not present

## 2020-04-14 DIAGNOSIS — Z23 Encounter for immunization: Secondary | ICD-10-CM | POA: Diagnosis not present

## 2020-04-14 DIAGNOSIS — Z794 Long term (current) use of insulin: Secondary | ICD-10-CM | POA: Diagnosis not present

## 2020-04-19 DIAGNOSIS — I120 Hypertensive chronic kidney disease with stage 5 chronic kidney disease or end stage renal disease: Secondary | ICD-10-CM | POA: Diagnosis not present

## 2020-04-19 DIAGNOSIS — N186 End stage renal disease: Secondary | ICD-10-CM | POA: Diagnosis not present

## 2020-04-19 DIAGNOSIS — Z87891 Personal history of nicotine dependence: Secondary | ICD-10-CM | POA: Diagnosis not present

## 2020-04-19 DIAGNOSIS — Z992 Dependence on renal dialysis: Secondary | ICD-10-CM | POA: Diagnosis not present

## 2020-04-19 DIAGNOSIS — I959 Hypotension, unspecified: Secondary | ICD-10-CM | POA: Diagnosis not present

## 2020-04-19 DIAGNOSIS — Z79899 Other long term (current) drug therapy: Secondary | ICD-10-CM | POA: Diagnosis not present

## 2020-04-19 DIAGNOSIS — E1151 Type 2 diabetes mellitus with diabetic peripheral angiopathy without gangrene: Secondary | ICD-10-CM | POA: Diagnosis not present

## 2020-04-19 DIAGNOSIS — Z794 Long term (current) use of insulin: Secondary | ICD-10-CM | POA: Diagnosis not present

## 2020-04-19 DIAGNOSIS — E8581 Light chain (AL) amyloidosis: Secondary | ICD-10-CM | POA: Diagnosis not present

## 2020-04-19 DIAGNOSIS — Z7952 Long term (current) use of systemic steroids: Secondary | ICD-10-CM | POA: Diagnosis not present

## 2020-04-19 DIAGNOSIS — I4891 Unspecified atrial fibrillation: Secondary | ICD-10-CM | POA: Diagnosis not present

## 2020-04-19 DIAGNOSIS — E1122 Type 2 diabetes mellitus with diabetic chronic kidney disease: Secondary | ICD-10-CM | POA: Diagnosis not present

## 2020-04-19 DIAGNOSIS — E538 Deficiency of other specified B group vitamins: Secondary | ICD-10-CM | POA: Diagnosis not present

## 2020-04-19 DIAGNOSIS — C9000 Multiple myeloma not having achieved remission: Secondary | ICD-10-CM | POA: Diagnosis not present

## 2020-04-19 DIAGNOSIS — E1142 Type 2 diabetes mellitus with diabetic polyneuropathy: Secondary | ICD-10-CM | POA: Diagnosis not present

## 2020-04-24 DIAGNOSIS — M8949 Other hypertrophic osteoarthropathy, multiple sites: Secondary | ICD-10-CM | POA: Diagnosis not present

## 2020-04-26 DIAGNOSIS — Z79899 Other long term (current) drug therapy: Secondary | ICD-10-CM | POA: Diagnosis not present

## 2020-04-26 DIAGNOSIS — E8581 Light chain (AL) amyloidosis: Secondary | ICD-10-CM | POA: Diagnosis not present

## 2020-05-24 DIAGNOSIS — M8949 Other hypertrophic osteoarthropathy, multiple sites: Secondary | ICD-10-CM | POA: Diagnosis not present

## 2020-06-24 DIAGNOSIS — M8949 Other hypertrophic osteoarthropathy, multiple sites: Secondary | ICD-10-CM | POA: Diagnosis not present

## 2020-07-21 DIAGNOSIS — Z20828 Contact with and (suspected) exposure to other viral communicable diseases: Secondary | ICD-10-CM | POA: Diagnosis not present

## 2020-07-25 DIAGNOSIS — M8949 Other hypertrophic osteoarthropathy, multiple sites: Secondary | ICD-10-CM | POA: Diagnosis not present

## 2020-07-28 DIAGNOSIS — E1121 Type 2 diabetes mellitus with diabetic nephropathy: Secondary | ICD-10-CM | POA: Diagnosis not present

## 2020-07-28 DIAGNOSIS — R0601 Orthopnea: Secondary | ICD-10-CM | POA: Diagnosis not present

## 2020-07-28 DIAGNOSIS — I43 Cardiomyopathy in diseases classified elsewhere: Secondary | ICD-10-CM | POA: Diagnosis not present

## 2020-07-28 DIAGNOSIS — J90 Pleural effusion, not elsewhere classified: Secondary | ICD-10-CM | POA: Diagnosis not present

## 2020-07-28 DIAGNOSIS — R0602 Shortness of breath: Secondary | ICD-10-CM | POA: Diagnosis not present

## 2020-07-28 DIAGNOSIS — J9811 Atelectasis: Secondary | ICD-10-CM | POA: Diagnosis not present

## 2020-07-28 DIAGNOSIS — I517 Cardiomegaly: Secondary | ICD-10-CM | POA: Diagnosis not present

## 2020-07-28 DIAGNOSIS — E039 Hypothyroidism, unspecified: Secondary | ICD-10-CM | POA: Diagnosis not present

## 2020-07-28 DIAGNOSIS — I10 Essential (primary) hypertension: Secondary | ICD-10-CM | POA: Diagnosis not present

## 2020-07-28 DIAGNOSIS — Z794 Long term (current) use of insulin: Secondary | ICD-10-CM | POA: Diagnosis not present

## 2020-07-28 DIAGNOSIS — E854 Organ-limited amyloidosis: Secondary | ICD-10-CM | POA: Diagnosis not present

## 2020-08-22 DIAGNOSIS — M8949 Other hypertrophic osteoarthropathy, multiple sites: Secondary | ICD-10-CM | POA: Diagnosis not present

## 2020-09-22 DIAGNOSIS — M8949 Other hypertrophic osteoarthropathy, multiple sites: Secondary | ICD-10-CM | POA: Diagnosis not present

## 2020-09-29 DIAGNOSIS — E8581 Light chain (AL) amyloidosis: Secondary | ICD-10-CM | POA: Diagnosis not present

## 2020-09-29 DIAGNOSIS — C9000 Multiple myeloma not having achieved remission: Secondary | ICD-10-CM | POA: Diagnosis not present

## 2020-09-29 DIAGNOSIS — E854 Organ-limited amyloidosis: Secondary | ICD-10-CM | POA: Diagnosis not present

## 2020-09-29 DIAGNOSIS — I5022 Chronic systolic (congestive) heart failure: Secondary | ICD-10-CM | POA: Diagnosis not present

## 2020-09-29 DIAGNOSIS — I43 Cardiomyopathy in diseases classified elsewhere: Secondary | ICD-10-CM | POA: Diagnosis not present

## 2020-09-29 DIAGNOSIS — I4821 Permanent atrial fibrillation: Secondary | ICD-10-CM | POA: Diagnosis not present

## 2020-09-29 DIAGNOSIS — D509 Iron deficiency anemia, unspecified: Secondary | ICD-10-CM | POA: Diagnosis not present

## 2020-10-22 DIAGNOSIS — M8949 Other hypertrophic osteoarthropathy, multiple sites: Secondary | ICD-10-CM | POA: Diagnosis not present

## 2020-11-11 DIAGNOSIS — J90 Pleural effusion, not elsewhere classified: Secondary | ICD-10-CM | POA: Diagnosis not present

## 2020-11-11 DIAGNOSIS — N186 End stage renal disease: Secondary | ICD-10-CM | POA: Diagnosis not present

## 2020-11-22 DIAGNOSIS — M8949 Other hypertrophic osteoarthropathy, multiple sites: Secondary | ICD-10-CM | POA: Diagnosis not present

## 2020-12-03 DIAGNOSIS — I509 Heart failure, unspecified: Secondary | ICD-10-CM | POA: Diagnosis not present

## 2020-12-03 DIAGNOSIS — Z87891 Personal history of nicotine dependence: Secondary | ICD-10-CM | POA: Diagnosis not present

## 2020-12-03 DIAGNOSIS — E859 Amyloidosis, unspecified: Secondary | ICD-10-CM | POA: Diagnosis not present

## 2020-12-03 DIAGNOSIS — Z7901 Long term (current) use of anticoagulants: Secondary | ICD-10-CM | POA: Diagnosis not present

## 2020-12-03 DIAGNOSIS — Z79899 Other long term (current) drug therapy: Secondary | ICD-10-CM | POA: Diagnosis not present

## 2020-12-03 DIAGNOSIS — I480 Paroxysmal atrial fibrillation: Secondary | ICD-10-CM | POA: Diagnosis not present

## 2020-12-03 DIAGNOSIS — C9000 Multiple myeloma not having achieved remission: Secondary | ICD-10-CM | POA: Diagnosis not present

## 2020-12-03 DIAGNOSIS — I132 Hypertensive heart and chronic kidney disease with heart failure and with stage 5 chronic kidney disease, or end stage renal disease: Secondary | ICD-10-CM | POA: Diagnosis not present

## 2020-12-03 DIAGNOSIS — E1122 Type 2 diabetes mellitus with diabetic chronic kidney disease: Secondary | ICD-10-CM | POA: Diagnosis not present

## 2020-12-03 DIAGNOSIS — Z794 Long term (current) use of insulin: Secondary | ICD-10-CM | POA: Diagnosis not present

## 2020-12-03 DIAGNOSIS — Z992 Dependence on renal dialysis: Secondary | ICD-10-CM | POA: Diagnosis not present

## 2020-12-03 DIAGNOSIS — N186 End stage renal disease: Secondary | ICD-10-CM | POA: Diagnosis not present

## 2020-12-08 DIAGNOSIS — I4821 Permanent atrial fibrillation: Secondary | ICD-10-CM | POA: Diagnosis not present

## 2020-12-08 DIAGNOSIS — E854 Organ-limited amyloidosis: Secondary | ICD-10-CM | POA: Diagnosis not present

## 2020-12-08 DIAGNOSIS — E1121 Type 2 diabetes mellitus with diabetic nephropathy: Secondary | ICD-10-CM | POA: Diagnosis not present

## 2020-12-08 DIAGNOSIS — Z794 Long term (current) use of insulin: Secondary | ICD-10-CM | POA: Diagnosis not present

## 2020-12-08 DIAGNOSIS — N186 End stage renal disease: Secondary | ICD-10-CM | POA: Diagnosis not present

## 2020-12-08 DIAGNOSIS — K7581 Nonalcoholic steatohepatitis (NASH): Secondary | ICD-10-CM | POA: Diagnosis not present

## 2020-12-08 DIAGNOSIS — I43 Cardiomyopathy in diseases classified elsewhere: Secondary | ICD-10-CM | POA: Diagnosis not present

## 2020-12-08 DIAGNOSIS — I5022 Chronic systolic (congestive) heart failure: Secondary | ICD-10-CM | POA: Diagnosis not present

## 2020-12-08 DIAGNOSIS — Z992 Dependence on renal dialysis: Secondary | ICD-10-CM | POA: Diagnosis not present

## 2020-12-08 DIAGNOSIS — I132 Hypertensive heart and chronic kidney disease with heart failure and with stage 5 chronic kidney disease, or end stage renal disease: Secondary | ICD-10-CM | POA: Diagnosis not present

## 2020-12-08 DIAGNOSIS — I25118 Atherosclerotic heart disease of native coronary artery with other forms of angina pectoris: Secondary | ICD-10-CM | POA: Diagnosis not present

## 2020-12-08 DIAGNOSIS — E039 Hypothyroidism, unspecified: Secondary | ICD-10-CM | POA: Diagnosis not present

## 2020-12-16 DIAGNOSIS — N186 End stage renal disease: Secondary | ICD-10-CM | POA: Diagnosis not present

## 2020-12-16 DIAGNOSIS — Z992 Dependence on renal dialysis: Secondary | ICD-10-CM | POA: Diagnosis not present

## 2020-12-29 DIAGNOSIS — J90 Pleural effusion, not elsewhere classified: Secondary | ICD-10-CM | POA: Diagnosis not present

## 2021-01-05 DIAGNOSIS — J90 Pleural effusion, not elsewhere classified: Secondary | ICD-10-CM | POA: Diagnosis not present

## 2021-01-05 DIAGNOSIS — N186 End stage renal disease: Secondary | ICD-10-CM | POA: Diagnosis not present

## 2021-01-05 DIAGNOSIS — Z888 Allergy status to other drugs, medicaments and biological substances status: Secondary | ICD-10-CM | POA: Diagnosis not present

## 2021-01-21 DIAGNOSIS — N08 Glomerular disorders in diseases classified elsewhere: Secondary | ICD-10-CM | POA: Diagnosis not present

## 2021-01-21 DIAGNOSIS — I5022 Chronic systolic (congestive) heart failure: Secondary | ICD-10-CM | POA: Diagnosis not present

## 2021-01-21 DIAGNOSIS — N186 End stage renal disease: Secondary | ICD-10-CM | POA: Diagnosis not present

## 2021-01-21 DIAGNOSIS — J90 Pleural effusion, not elsewhere classified: Secondary | ICD-10-CM | POA: Diagnosis not present

## 2021-01-21 DIAGNOSIS — I4821 Permanent atrial fibrillation: Secondary | ICD-10-CM | POA: Diagnosis not present

## 2021-01-21 DIAGNOSIS — C9000 Multiple myeloma not having achieved remission: Secondary | ICD-10-CM | POA: Diagnosis not present

## 2021-01-21 DIAGNOSIS — Z992 Dependence on renal dialysis: Secondary | ICD-10-CM | POA: Diagnosis not present

## 2021-01-21 DIAGNOSIS — E854 Organ-limited amyloidosis: Secondary | ICD-10-CM | POA: Diagnosis not present

## 2021-01-21 DIAGNOSIS — E8581 Light chain (AL) amyloidosis: Secondary | ICD-10-CM | POA: Diagnosis not present

## 2021-01-26 DIAGNOSIS — Z20822 Contact with and (suspected) exposure to covid-19: Secondary | ICD-10-CM | POA: Diagnosis not present

## 2021-02-22 DIAGNOSIS — Z87891 Personal history of nicotine dependence: Secondary | ICD-10-CM | POA: Diagnosis not present

## 2021-02-22 DIAGNOSIS — D682 Hereditary deficiency of other clotting factors: Secondary | ICD-10-CM | POA: Diagnosis not present

## 2021-02-22 DIAGNOSIS — D509 Iron deficiency anemia, unspecified: Secondary | ICD-10-CM | POA: Diagnosis not present

## 2021-02-22 DIAGNOSIS — Z8679 Personal history of other diseases of the circulatory system: Secondary | ICD-10-CM | POA: Diagnosis not present

## 2021-02-22 DIAGNOSIS — D61818 Other pancytopenia: Secondary | ICD-10-CM | POA: Diagnosis not present

## 2021-02-22 DIAGNOSIS — J90 Pleural effusion, not elsewhere classified: Secondary | ICD-10-CM | POA: Diagnosis not present

## 2021-02-22 DIAGNOSIS — E8581 Light chain (AL) amyloidosis: Secondary | ICD-10-CM | POA: Diagnosis not present

## 2021-02-22 DIAGNOSIS — K219 Gastro-esophageal reflux disease without esophagitis: Secondary | ICD-10-CM | POA: Diagnosis not present

## 2021-02-22 DIAGNOSIS — N189 Chronic kidney disease, unspecified: Secondary | ICD-10-CM | POA: Diagnosis not present

## 2021-02-22 DIAGNOSIS — G5600 Carpal tunnel syndrome, unspecified upper limb: Secondary | ICD-10-CM | POA: Diagnosis not present

## 2021-02-22 DIAGNOSIS — I482 Chronic atrial fibrillation, unspecified: Secondary | ICD-10-CM | POA: Diagnosis not present

## 2021-02-22 DIAGNOSIS — B009 Herpesviral infection, unspecified: Secondary | ICD-10-CM | POA: Diagnosis not present

## 2021-02-22 DIAGNOSIS — G629 Polyneuropathy, unspecified: Secondary | ICD-10-CM | POA: Diagnosis not present

## 2021-02-22 DIAGNOSIS — G5623 Lesion of ulnar nerve, bilateral upper limbs: Secondary | ICD-10-CM | POA: Diagnosis not present

## 2021-02-22 DIAGNOSIS — Z79899 Other long term (current) drug therapy: Secondary | ICD-10-CM | POA: Diagnosis not present

## 2021-02-22 DIAGNOSIS — Z992 Dependence on renal dialysis: Secondary | ICD-10-CM | POA: Diagnosis not present

## 2021-04-11 DIAGNOSIS — E039 Hypothyroidism, unspecified: Secondary | ICD-10-CM | POA: Diagnosis not present

## 2021-04-11 DIAGNOSIS — N186 End stage renal disease: Secondary | ICD-10-CM | POA: Diagnosis not present

## 2021-04-11 DIAGNOSIS — Z992 Dependence on renal dialysis: Secondary | ICD-10-CM | POA: Diagnosis not present

## 2021-04-11 DIAGNOSIS — Z794 Long term (current) use of insulin: Secondary | ICD-10-CM | POA: Diagnosis not present

## 2021-04-11 DIAGNOSIS — C9000 Multiple myeloma not having achieved remission: Secondary | ICD-10-CM | POA: Diagnosis not present

## 2021-04-11 DIAGNOSIS — I25118 Atherosclerotic heart disease of native coronary artery with other forms of angina pectoris: Secondary | ICD-10-CM | POA: Diagnosis not present

## 2021-04-11 DIAGNOSIS — E853 Secondary systemic amyloidosis: Secondary | ICD-10-CM | POA: Diagnosis not present

## 2021-04-11 DIAGNOSIS — T466X5A Adverse effect of antihyperlipidemic and antiarteriosclerotic drugs, initial encounter: Secondary | ICD-10-CM | POA: Diagnosis not present

## 2021-04-11 DIAGNOSIS — E1121 Type 2 diabetes mellitus with diabetic nephropathy: Secondary | ICD-10-CM | POA: Diagnosis not present

## 2021-04-11 DIAGNOSIS — D509 Iron deficiency anemia, unspecified: Secondary | ICD-10-CM | POA: Diagnosis not present

## 2021-04-11 DIAGNOSIS — E538 Deficiency of other specified B group vitamins: Secondary | ICD-10-CM | POA: Diagnosis not present

## 2021-04-11 DIAGNOSIS — M791 Myalgia, unspecified site: Secondary | ICD-10-CM | POA: Diagnosis not present
